# Patient Record
Sex: Male | Born: 1965 | Race: Asian | Smoking: Current some day smoker
Health system: Southern US, Community
[De-identification: ages and names within clinical notes are randomized; demographics above are authoritative.]

## PROBLEM LIST (undated history)

## (undated) HISTORY — PX: APPENDECTOMY: SHX54

## (undated) HISTORY — PX: CHOLECYSTECTOMY: SHX55

---

## 2015-06-18 ENCOUNTER — Other Ambulatory Visit: Payer: Self-pay | Admitting: Infectious Disease

## 2015-06-18 ENCOUNTER — Ambulatory Visit
Admission: RE | Admit: 2015-06-18 | Discharge: 2015-06-18 | Disposition: A | Discharge status: Home / Self Care | Payer: No Typology Code available for payment source | Source: Ambulatory Visit | Attending: Infectious Disease | Admitting: Infectious Disease

## 2015-06-18 DIAGNOSIS — Z111 Encounter for screening for respiratory tuberculosis: Secondary | ICD-10-CM

## 2015-08-08 ENCOUNTER — Other Ambulatory Visit: Payer: Self-pay | Admitting: Otolaryngology

## 2015-08-08 DIAGNOSIS — R430 Anosmia: Secondary | ICD-10-CM

## 2015-08-26 ENCOUNTER — Ambulatory Visit
Admission: RE | Admit: 2015-08-26 | Discharge: 2015-08-26 | Disposition: A | Discharge status: Home / Self Care | Payer: Commercial Managed Care - PPO | Source: Ambulatory Visit | Attending: Otolaryngology | Admitting: Otolaryngology

## 2015-08-26 DIAGNOSIS — R430 Anosmia: Secondary | ICD-10-CM

## 2015-08-26 MED ORDER — GADOBENATE DIMEGLUMINE 529 MG/ML IV SOLN
10.0000 mL | Freq: Once | INTRAVENOUS | Status: AC | PRN
  Administered 2015-08-26: 10 mL via INTRAVENOUS

## 2015-10-01 ENCOUNTER — Other Ambulatory Visit: Payer: Self-pay | Admitting: Infectious Disease

## 2015-10-01 ENCOUNTER — Ambulatory Visit
Admission: RE | Admit: 2015-10-01 | Discharge: 2015-10-01 | Disposition: A | Discharge status: Home / Self Care | Payer: No Typology Code available for payment source | Source: Ambulatory Visit | Attending: Infectious Disease | Admitting: Infectious Disease

## 2015-10-01 DIAGNOSIS — R7611 Nonspecific reaction to tuberculin skin test without active tuberculosis: Secondary | ICD-10-CM

## 2017-02-23 IMAGING — CR DG CHEST 1V
1 series · 1 of 1 positions shown · non-contrast
Comparison: 06/18/2015.

CLINICAL DATA: Positive PPD.  Follow up abnormal chest x-ray.

EXAM:
CHEST 1 VIEW

[w chest pa]
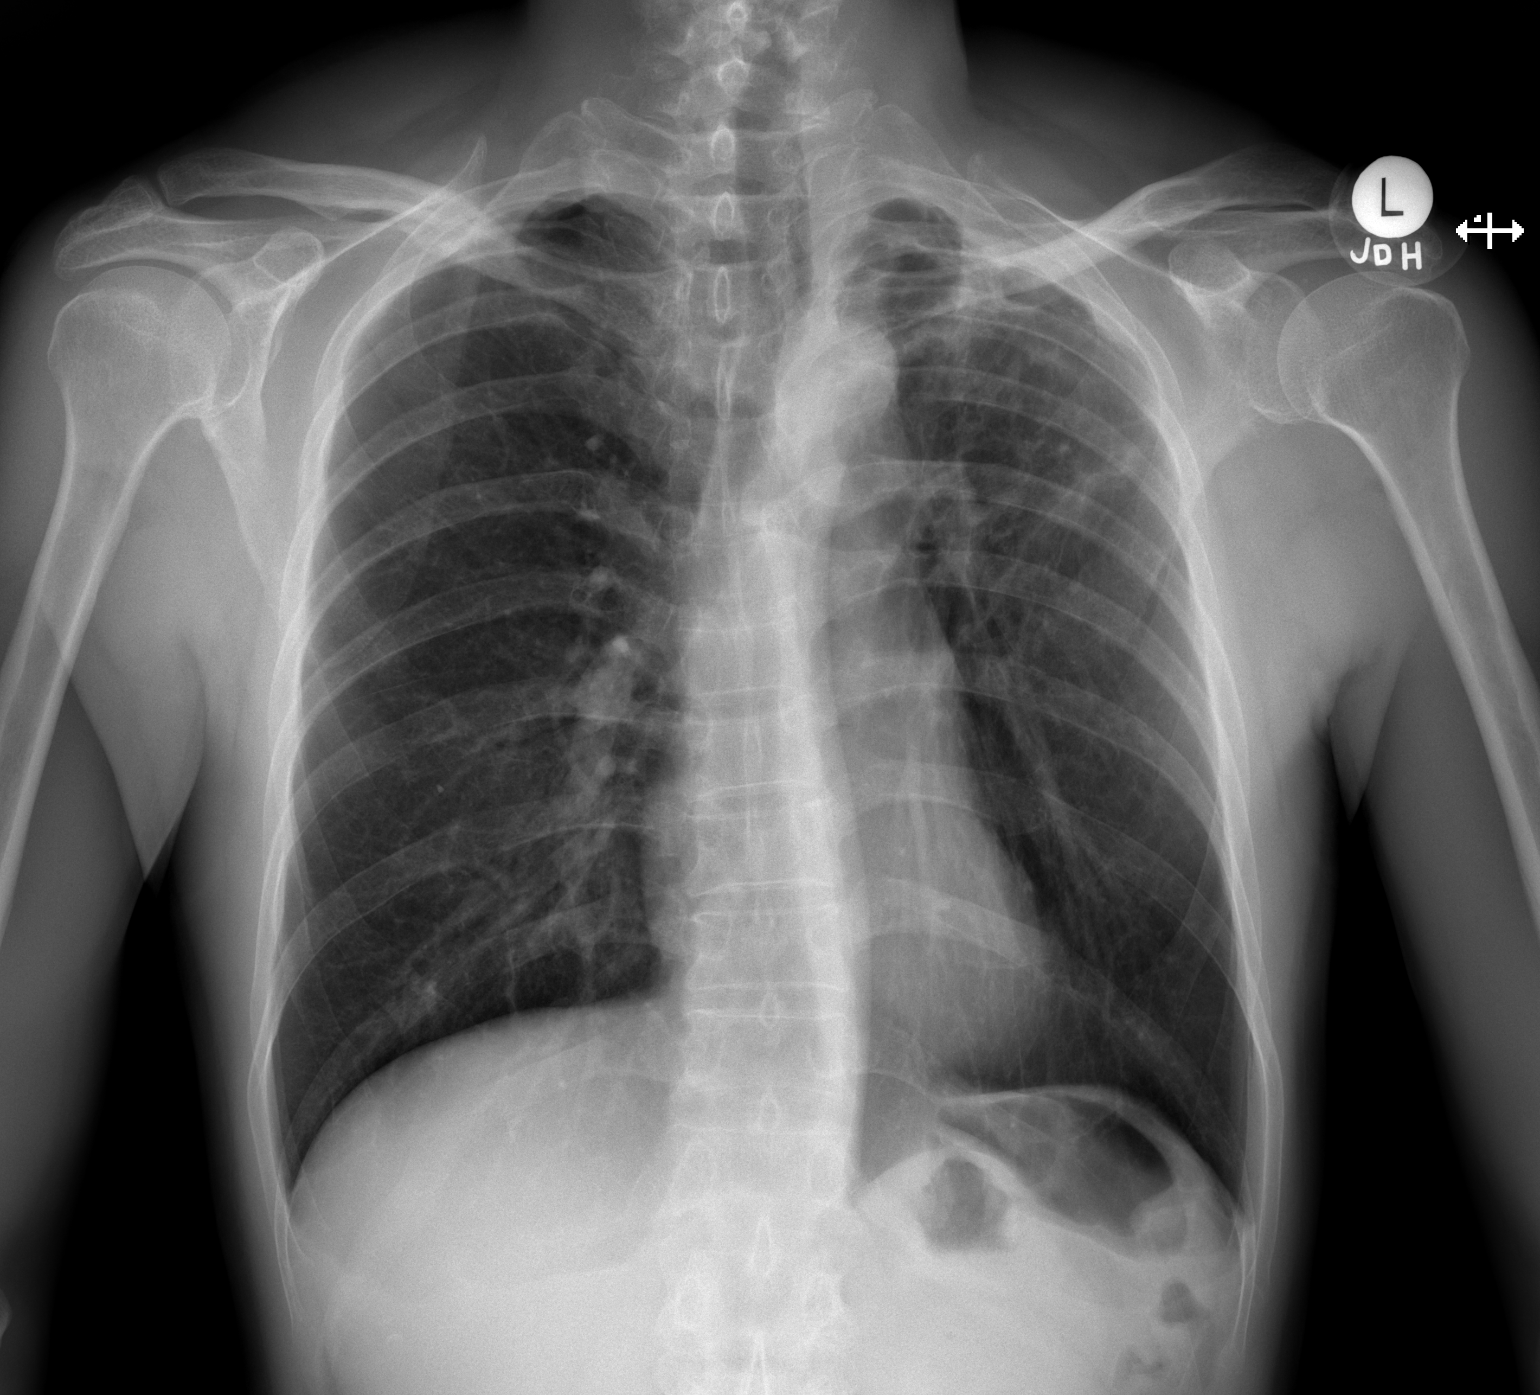

[1 of 1 positions shown; findings below may reference images not displayed]

FINDINGS: The heart size and mediastinal contours are stable with left
superior hilar retraction. There is stable left apical
pleural-parenchymal density consistent with prior tuberculosis
infection. No superimposed airspace disease, pleural effusion or
pneumothorax identified. The right lung is clear. The bones appear
unchanged.
IMPRESSION: Stable appearance of the chest compared with prior study from
months ago. Appearance remains most consistent with prior
tuberculosis infection. No acute findings seen.

## 2020-08-19 ENCOUNTER — Other Ambulatory Visit: Payer: Self-pay

## 2020-08-20 ENCOUNTER — Encounter: Payer: Self-pay | Admitting: Family Medicine

## 2020-08-20 ENCOUNTER — Ambulatory Visit (INDEPENDENT_AMBULATORY_CARE_PROVIDER_SITE_OTHER): Payer: 59 | Admitting: Family Medicine

## 2020-08-20 VITALS — BP 118/68 | HR 63 | Temp 98.0°F | Ht 63.0 in | Wt 113.2 lb

## 2020-08-20 DIAGNOSIS — T733XXA Exhaustion due to excessive exertion, initial encounter: Secondary | ICD-10-CM

## 2020-08-20 DIAGNOSIS — Q684 Congenital bowing of tibia and fibula: Secondary | ICD-10-CM | POA: Diagnosis not present

## 2020-08-20 DIAGNOSIS — R627 Adult failure to thrive: Secondary | ICD-10-CM | POA: Diagnosis not present

## 2020-08-20 DIAGNOSIS — Z13220 Encounter for screening for lipoid disorders: Secondary | ICD-10-CM

## 2020-08-20 DIAGNOSIS — E785 Hyperlipidemia, unspecified: Secondary | ICD-10-CM | POA: Insufficient documentation

## 2020-08-20 DIAGNOSIS — R350 Frequency of micturition: Secondary | ICD-10-CM

## 2020-08-20 LAB — LIPID PANEL
Cholesterol: 223 mg/dL — ABNORMAL HIGH (ref 0–200)
HDL: 66.3 mg/dL (ref >39.00)
LDL Cholesterol: 138 mg/dL — ABNORMAL HIGH (ref 0–99)
NonHDL: 157.05
Total CHOL/HDL Ratio: 3
Triglycerides: 96 mg/dL (ref 0.0–149.0)
VLDL: 19.2 mg/dL (ref 0.0–40.0)

## 2020-08-20 LAB — CBC
HCT: 42.4 % (ref 39.0–52.0)
Hemoglobin: 14.4 g/dL (ref 13.0–17.0)
MCHC: 33.9 g/dL (ref 30.0–36.0)
MCV: 91.1 fl (ref 78.0–100.0)
Platelets: 223 10*3/uL (ref 150.0–400.0)
RBC: 4.65 Mil/uL (ref 4.22–5.81)
RDW: 13.6 % (ref 11.5–15.5)
WBC: 5.4 10*3/uL (ref 4.0–10.5)

## 2020-08-20 NOTE — Progress Notes (Signed)
[facility] [address] [place] [address] Dept: [phone] Dept Fax: [phone]  New Patient Office Visit  Subjective:    Patient ID: Travis Knight, male    DOB: Jan 24, 1966, 55 y.o..   MRN: [id number]  Chief Complaint  Patient presents with  . Establish Care    NP- CPE/labs. C/o getting fatigue with physical activity and wants to gain weight but unable to do so.     History of Present Illness:  Patient is in today to establish care. He was accompanied by Travis Knight a [place] interpreter with Travis Knight.  Travis Knight is originally from a [place] village in [place]. He immigrated to [place] 7 years ago. He is separated from his wife. He lives with a daughter. He has four children, though one is deceased. Travis Knight works for a [employer] business. He admits to smoking 1 cigarette a day. He denies use of alcohol or drugs.  Travis Knight complains of frequent feelings of fatigue and an inability to gain weight. He has a very physically demanding job. He only eats a very small breakfast. During his workday, he is not allowed time for a lunch break. He drinks coffee throughout the day. He eats a larger dinner. Despite this, he finds he has been unable to gain weight.  At the end of the visit, he brought up an issue with urinary frequency/nocturia.  Past Medical History: Patient Active Problem List   Diagnosis Date Noted  . 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 Fatigue due to excessive exertion 08/20/2020  . Poor weight gain in adult 08/20/2020   Past Surgical History:  Procedure Laterality Date  . CHOLECYSTECTOMY     No family history on file.  No outpatient medications prior to visit.   No facility-administered medications prior to visit.   No Known Allergies    Objective:   Today's Vitals   08/20/20 0842  BP: 118/68  Pulse: 63  Temp: 98 F (36.7 C)  TempSrc: Temporal  SpO2: 98%  Weight: 113 lb 3.2 oz  (51.3 kg)  Height: 5\' 3"  (1.6 m)   Body mass index is 20.05 kg/m.   General: Slim. No acute distress. HEENT: Normocephalic, non-traumatic. PERRL, EOMI. Conjunctiva clear. Fundiscopic exam shows normal  disc and vasculature. External ears normal. EAC and TMs normal bilaterally. Nose    clear without congestion or rhinorrhea. Mucous membranes moist. Oropharynx clear. Poor dentition. Neck: Supple. No lymphadenopathy. No thyromegaly. Lungs: Clear to auscultation bilaterally. No wheezing, rales or rhonchi. CV: RRR without murmurs or rubs. Pulses 2+ bilaterally. Abdomen: Soft, non-tender. Bowel sounds positive, normal pitch and frequency. No hepatosplenomegaly. No   rebound or guarding. Well healed vertical scar in the upper mid abdomen. Back: Straight. No CVA tenderness bilaterally. Extremities: Full ROM. Mild joint popping in elbows, shoulders and knees. Both tibias show lateral bowing in   the upper third of the bone. This causes the fibular head to be quite prominent. No  joint swelling or   tenderness. No edema noted. Skin: Warm and dry. No rashes. Psych: Alert and oriented. Normal mood and affect.  Health Maintenance Due  Topic Date Due  . Hepatitis C Screening  Never done  . COVID-19 Vaccine (1) Never done  . HIV Screening  Never done  . TETANUS/TDAP  Never done  . COLONOSCOPY (Pts 45-69yrs Insurance coverage will need to be confirmed)  Never done     Assessment & Plan:   1. Fatigue due  to excessive exertion, initial encounter [name] fatigue appears to be related to heavy physical exertion and lack of adequate daily calories. However, I will check a CBC to rule out any potential anemia. We discussed him attempting to eat a larger breakfast. Today's visit was shortened due to time constraints, so will try and address further at his next visit.  - CBC  2. Poor weight gain in adult As above, I suspect his weight concerns are due to excessive exertion and inadequate calorie  intake. He does not have options for other work currently.  3. Congenital bowed tibia Both upper tibia have a valgus derformity. This appears to be congenital or from childhood. He is able to ambulate and work. I suspect he may be at risk for arthritic complaints a he ages.  4. Screening for lipid disorders  - Lipid panel  His use of 1 cigarette a day may not constitute a problem. We did discuss the lack of evidence for a safe level of tobacco use, but neither can I clearly state that his minimal use is dangerous.  I recommended he have a colonoscopy. He declined this.  5. Urinary frequency We will address this further at his next visit.  Travis Mast, MD

## 2020-09-26 ENCOUNTER — Ambulatory Visit: Payer: 59 | Admitting: Family Medicine

## 2020-10-08 ENCOUNTER — Ambulatory Visit: Payer: 59 | Admitting: Family Medicine

## 2020-10-08 ENCOUNTER — Encounter: Payer: Self-pay | Admitting: Family Medicine

## 2020-10-08 ENCOUNTER — Other Ambulatory Visit: Payer: Self-pay

## 2020-10-08 VITALS — BP 106/64 | HR 83 | Temp 97.9°F | Ht 63.0 in | Wt 109.4 lb

## 2020-10-08 DIAGNOSIS — R627 Adult failure to thrive: Secondary | ICD-10-CM | POA: Diagnosis not present

## 2020-10-08 DIAGNOSIS — R350 Frequency of micturition: Secondary | ICD-10-CM

## 2020-10-08 DIAGNOSIS — R7303 Prediabetes: Secondary | ICD-10-CM | POA: Insufficient documentation

## 2020-10-08 LAB — URINALYSIS, ROUTINE W REFLEX MICROSCOPIC
Bilirubin Urine: NEGATIVE
Hgb urine dipstick: NEGATIVE
Ketones, ur: NEGATIVE
Leukocytes,Ua: NEGATIVE
Nitrite: NEGATIVE
RBC / HPF: NONE SEEN (ref 0–?)
Specific Gravity, Urine: 1.02 (ref 1.000–1.030)
Total Protein, Urine: NEGATIVE
Urine Glucose: NEGATIVE
Urobilinogen, UA: 0.2 (ref 0.0–1.0)
pH: 5.5 (ref 5.0–8.0)

## 2020-10-08 LAB — COMPREHENSIVE METABOLIC PANEL
ALT: 16 U/L (ref 0–53)
AST: 25 U/L (ref 0–37)
Albumin: 4.7 g/dL (ref 3.5–5.2)
Alkaline Phosphatase: 51 U/L (ref 39–117)
BUN: 17 mg/dL (ref 6–23)
CO2: 27 mEq/L (ref 19–32)
Calcium: 9.5 mg/dL (ref 8.4–10.5)
Chloride: 102 mEq/L (ref 96–112)
Creatinine, Ser: 1.02 mg/dL (ref 0.40–1.50)
GFR: 83.27 mL/min (ref >60.00)
Glucose, Bld: 87 mg/dL (ref 70–99)
Potassium: 4.2 mEq/L (ref 3.5–5.1)
Sodium: 139 mEq/L (ref 135–145)
Total Bilirubin: 0.5 mg/dL (ref 0.2–1.2)
Total Protein: 7.4 g/dL (ref 6.0–8.3)

## 2020-10-08 LAB — TSH: TSH: 2.67 u[IU]/mL (ref 0.35–4.50)

## 2020-10-08 LAB — PSA: PSA: 1.21 ng/mL (ref 0.10–4.00)

## 2020-10-08 LAB — HEMOGLOBIN A1C: Hgb A1c MFr Bld: 6.1 % (ref 4.6–6.5)

## 2020-10-08 NOTE — Progress Notes (Signed)
[facility] [address] [place] [address] Dept: [phone] Dept Fax: [phone]  Office Visit  Subjective:    Patient ID: Travis Knight, male    DOB: 18-Jan-1966, 55 y.o..   MRN: [id number]  Chief Complaint  Patient presents with  . Follow-up    Still having urine frequency (10 x day),. Also having trouble sleeping (only about 5 hours a night).      History of Present Illness:  Patient is in today for reassessment of his poor weight gain and to evaluate his complaint of urinary frequency. Travis Knight is accompanied by his brother-in-law, who he used to live with in [place]. We also utilized a medical interpreter Thurston Hole, [phone]).  Travis Knight notes that he has been in good health since his last appointment. He continues to work long hours almost every day. He notes he continues to not eat very much. He denies any hunger issues. His brother-in-law notes that the eating has been an issue even prior to coming to the [place]. He states they try and provide some Ensure for him, but that seems to satiate him and he doesn't eat further. Travis Knight also complains of urinary frequency, but denies any straining. He gets up to urinate 6-7 times a night, which also predates his immigration. He denies any visual blurring and has no family history of diabetes. He denies any nausea, vomiting, diarrhea, or constipation. He also has no abdominal pain. He has had a previous open cholecystectomy.  Past Medical History: Patient Active Problem List   Diagnosis Date Noted  . 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 Fatigue due to excessive exertion 08/20/2020  . Poor weight gain in adult 08/20/2020  . Borderline hyperlipidemia 08/20/2020   Past Surgical History:  Procedure Laterality Date  . CHOLECYSTECTOMY     No family history on file.  No outpatient medications prior to visit.   No facility-administered medications prior to visit.   No Known Allergies     Objective:   Today's Vitals   10/08/20 0840  BP: 106/64  Pulse: 83  Temp: 97.9 F (36.6 C)  TempSrc: Temporal  SpO2: 96%  Weight: 109 lb 6.4 oz (49.6 kg)  Height: 5\' 3"  (1.6 m)   Body mass index is 19.38 kg/m.   General: Thin, elderly male. No acute distress. HEENT: Normocephalic, non-traumatic. Mucous membranes moist. Oropharynx clear. Good dentition. Neck: Supple. No lymphadenopathy. No thyromegaly. Lungs: Clear to auscultation bilaterally. No wheezing, rales or rhonchi. CV: RRR without murmurs or rubs. Pulses 2+ bilaterally. Abdomen: Soft, non-tender. Midline vertical surgical scar between umbilicus and epigastrium.  Bowel sounds positive, normal  pitch and frequency. No hepatosplenomegaly. No rebound or guarding. Back: Straight. No CVA tenderness bilaterally. Extremities: No edema noted. Skin: Warm and dry. No rashes. Psych: Alert and oriented. Normal mood and affect.  Health Maintenance Due  Topic Date Due  . COVID-19 Vaccine (1) Never done  . HIV Screening  Never done  . Hepatitis C Screening  Never done  . TETANUS/TDAP  Never done  . COLONOSCOPY (Pts 45-32yrs Insurance coverage will need to be confirmed)  Never done     Assessment & Plan:   1. Poor weight gain in adult It is unclear what the etiology of this might be. I will do screening for liver, kidney, thyroid, and metabolic issues that might contribute, esp. possible diabetes. I will have him return in two weeks for a review of his lab results.  - Comprehensive  metabolic panel - TSH - Hemoglobin A1c  2. Urinary frequency As above, I feel he needs screening for diabetes. I also recommend we assess him for UTI and prostate issues.  - Hemoglobin A1c - Urinalysis, Routine w reflex microscopic - PSA  Loyola Mast, MD

## 2020-10-24 ENCOUNTER — Other Ambulatory Visit: Payer: Self-pay

## 2020-10-24 ENCOUNTER — Encounter: Payer: Self-pay | Admitting: Family Medicine

## 2020-10-24 ENCOUNTER — Ambulatory Visit: Payer: 59 | Admitting: Family Medicine

## 2020-10-24 VITALS — BP 118/64 | HR 63 | Temp 98.4°F | Ht 63.0 in | Wt 112.2 lb

## 2020-10-24 DIAGNOSIS — R627 Adult failure to thrive: Secondary | ICD-10-CM | POA: Diagnosis not present

## 2020-10-24 DIAGNOSIS — R7303 Prediabetes: Secondary | ICD-10-CM

## 2020-10-24 NOTE — Progress Notes (Signed)
  [facility] [address] [place] [address] Dept: [phone] Dept Fax: [phone]  Office Visit  Subjective:    Patient ID: Travis Knight, male    DOB: 1966/04/24, 55 y.o..   MRN: [id number]  Chief Complaint  Patient presents with   Follow-up    F/u labs. No concerns.     History of Present Illness:  Patient is in today for today to review labs related to evaluating for causes of his poor weight gain and his complaint of urinary frequency. Travis Knight is accompanied by his brother-in-law. We also utilized a medical interpreter Travis Knight, [phone]).  Travis Knight denies any new issues since his last visit. He wonders about whether I can prescribe a medicaiton to make him gain weight.  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  No outpatient medications prior to visit.   No facility-administered medications prior to visit.   No Known Allergies    Objective:   Today's Vitals   10/24/20 0853  BP: 118/64  Pulse: 63  Temp: 98.4 F (36.9 C)  TempSrc: Temporal  SpO2: 96%  Weight: 112 lb 3.2 oz (50.9 kg)  Height: 5\' 3"  (1.6 m)   Body mass index is 19.88 kg/m.   General: Well developed, well nourished. No acute distress. Psych: Alert and oriented. Normal mood and affect.  Health Maintenance Due  Topic Date Due   COVID-19 Vaccine (1) Never done   Pneumococcal Vaccine 71-26 Years old (1 - PCV) Never done   HIV Screening  Never done   Hepatitis C Screening  Never done   TETANUS/TDAP  Never done   Zoster Vaccines- Shingrix (1 of 2) Never done   Lab Results CMP Latest Ref Rng & Units 10/08/2020   Glucose 70 - 99 mg/dL 87  BUN 6 - 23 mg/dL 17  Creatinine 10/10/2020 - 9.48 mg/dL 0.16  Sodium 5.53 - 748 mEq/L 139  Potassium 3.5 - 5.1 mEq/L 4.2  Chloride 96 - 112 mEq/L 102  CO2 19 - 32 mEq/L 27  Calcium 8.4 - 10.5 mg/dL 9.5  Total Protein 6.0 - 8.3 g/dL 7.4  Total Bilirubin 0.2 - 1.2 mg/dL 0.5  Alkaline Phos 39 - 117 U/L 51  AST 0 - 37 U/L 25  ALT 0 - 53 U/L 16    Lab Results  Component Value Date   PSA 1.21 10/08/2020    Lab Results  Component Value Date   HGBA1C 6.1 10/08/2020   Lab Results  Component Value Date   TSH 2.67 10/08/2020     Assessment & Plan:   1. Poor weight gain in adult Weight is up 3 lbs. since last visit. I reassured him that his lab tests do not indicate any underlying disease that could be causing his weight loss. I will plan to reassess him in 6 months. I recommend he try to eat regular meals, which he has skipped at times due to a busy workload.  2. Prediabetes Travis Knight's HbA1c is in a prediabetes range. I recommend we reassess this and his lipid level in 6 months.  10/10/2020, MD

## 2021-04-29 ENCOUNTER — Ambulatory Visit: Payer: 59 | Admitting: Family Medicine

## 2022-01-21 ENCOUNTER — Emergency Department (HOSPITAL_COMMUNITY)
Admission: EM | Admit: 2022-01-21 | Discharge: 2022-01-21 | Disposition: A | Discharge status: Home / Self Care | Payer: Commercial Managed Care - HMO | Attending: Emergency Medicine | Admitting: Emergency Medicine

## 2022-01-21 ENCOUNTER — Emergency Department (HOSPITAL_COMMUNITY): Payer: Commercial Managed Care - HMO

## 2022-01-21 DIAGNOSIS — R079 Chest pain, unspecified: Secondary | ICD-10-CM | POA: Diagnosis present

## 2022-01-21 LAB — BASIC METABOLIC PANEL
Anion gap: 6 (ref 5–15)
BUN: 17 mg/dL (ref 6–20)
CO2: 26 mmol/L (ref 22–32)
Calcium: 8.8 mg/dL — ABNORMAL LOW (ref 8.9–10.3)
Chloride: 109 mmol/L (ref 98–111)
Creatinine, Ser: 1.03 mg/dL (ref 0.61–1.24)
GFR, Estimated: >60 mL/min (ref >60)
Glucose, Bld: 96 mg/dL (ref 70–99)
Potassium: 3.9 mmol/L (ref 3.5–5.1)
Sodium: 141 mmol/L (ref 135–145)

## 2022-01-21 LAB — CBC
HCT: 42.6 % (ref 39.0–52.0)
Hemoglobin: 14.1 g/dL (ref 13.0–17.0)
MCH: 30.6 pg (ref 26.0–34.0)
MCHC: 33.1 g/dL (ref 30.0–36.0)
MCV: 92.4 fL (ref 80.0–100.0)
Platelets: 272 10*3/uL (ref 150–400)
RBC: 4.61 MIL/uL (ref 4.22–5.81)
RDW: 13.5 % (ref 11.5–15.5)
WBC: 8.6 10*3/uL (ref 4.0–10.5)
nRBC: 0 % (ref 0.0–0.2)

## 2022-01-21 LAB — TROPONIN I (HIGH SENSITIVITY)
Troponin I (High Sensitivity): 3 ng/L (ref <18)
Troponin I (High Sensitivity): 4 ng/L (ref <18)

## 2022-01-21 MED ORDER — ACETAMINOPHEN 500 MG PO TABS: 500.0000 mg | ORAL_TABLET | Freq: Four times a day (QID) | ORAL | 0 refills | Status: AC | PRN

## 2022-01-21 NOTE — ED Provider Notes (Signed)
[facility]-EMERGENCY DEPT Provider Note   CSN: [id number] Arrival date & time: 01/21/22  1428     History  Chief Complaint  Patient presents with   Chest Pain    Travis Knight is a 56 y.o. male history of prediabetes and hypertension here presenting with chest pain.  Patient states that he has left-sided chest pain since yesterday.  He states that it is intermittent.  Patient took extra dose of his blood pressure medicine today and the chest pain subsided.  Denies any shortness of breath.  Denies any abdominal pain.  Patient has no known CAD or any recent travel.  Patient speaks [place] and family is here translating.  The history is provided by the patient.       Home Medications Prior to Admission medications   Not on File      Allergies    Patient has no known allergies.    Review of Systems   Review of Systems  Cardiovascular:  Positive for chest pain.  All other systems reviewed and are negative.   Physical Exam Updated Vital Signs BP (!) 112/59 (BP Location: Left Arm)   Pulse (!) 54   Temp 98.5 F (36.9 C) (Oral)   Resp 20   Ht 5\' 3"  (1.6 m)   Wt 50.9 kg   SpO2 99%   BMI 19.88 kg/m  Physical Exam Vitals and nursing note reviewed.  Constitutional:      Appearance: He is well-developed.  HENT:     Head: Normocephalic.  Eyes:     Extraocular Movements: Extraocular movements intact.     Pupils: Pupils are equal, round, and reactive to light.  Cardiovascular:     Rate and Rhythm: Normal rate and regular rhythm.     Heart sounds: Normal heart sounds.  Pulmonary:     Effort: Pulmonary effort is normal.     Breath sounds: Normal breath sounds.  Abdominal:     General: Bowel sounds are normal.     Palpations: Abdomen is soft.  Musculoskeletal:        General: Normal range of motion.     Cervical back: Normal range of motion and neck supple.  Skin:    General: Skin is warm.  Neurological:     General: No focal deficit present.      Mental Status: He is alert and oriented to person, place, and time.  Psychiatric:        Mood and Affect: Mood normal.        Behavior: Behavior normal.     ED Results / Procedures / Treatments   Labs (all labs ordered are listed, but only abnormal results are displayed) Labs Reviewed  BASIC METABOLIC PANEL - Abnormal; Notable for the following components:      Result Value   Calcium 8.8 (*)    All other components within normal limits  CBC  TROPONIN I (HIGH SENSITIVITY)  TROPONIN I (HIGH SENSITIVITY)    EKG EKG Interpretation  Date/Time:  Thursday January 21 2022 14:45:27 EDT Ventricular Rate:  52 PR Interval:  164 QRS Duration: 90 QT Interval:  416 QTC Calculation: 387 R Axis:   96 Text Interpretation: Sinus rhythm Borderline right axis deviation ST elevation, consider inferior injury repeat ordered No old tracing to compare Confirmed by 04-08-1988 Derwood Kaplan) on 01/21/2022 3:11:46 PM  Radiology DG Chest 2 View  Result Date: 01/21/2022 CLINICAL DATA:  Chest pain. EXAM: CHEST - 2 VIEW COMPARISON:  Chest radiograph Oct 01, 2015. FINDINGS: The heart size and mediastinal contours are within normal limits. Stable left apical pleuroparenchymal density consistent with prior tuberculous infection. No new superimposed airspace disease. No pleural effusion. No pneumothorax. The visualized skeletal structures are unremarkable. IMPRESSION: No acute cardiopulmonary disease. Electronically Signed   By: Maudry Mayhew M.D.   On: 01/21/2022 15:38    Procedures Procedures    Medications Ordered in ED Medications - No data to display  ED Course/ Medical Decision Making/ A&P                           Medical Decision Making Travis Knight is a 56 y.o. male here with chest pain.  Intermittent chest pain since yesterday.  Patient is chest pain-free currently.  He did take extra dose of his blood pressure medicine.  Low suspicion for ACS or PE.  Plan to get CBC and BMP and troponin x2  and chest x-ray.  7:49 PM Trop neg x 2. Labs reviewed and unremarkable.  Stable for discharge  Problems Addressed: Chest pain, unspecified type: acute illness or injury  Amount and/or Complexity of Data Reviewed Labs: ordered. Decision-making details documented in ED Course. Radiology: ordered and independent interpretation performed. Decision-making details documented in ED Course. ECG/medicine tests: ordered and independent interpretation performed. Decision-making details documented in ED Course.    Final Clinical Impression(s) / ED Diagnoses Final diagnoses:  None    Rx / DC Orders ED Discharge Orders     None         Charlynne Pander, MD 01/21/22 1950

## 2022-01-21 NOTE — ED Notes (Signed)
An After Visit Summary was printed and given to the patient. Discharge instructions given and no further questions at this time.  Pt and pt family did not wait for discharge vital signs.

## 2022-01-21 NOTE — ED Provider Triage Note (Signed)
Emergency Medicine Provider Triage Evaluation Note  Travis Knight , a 56 y.o. male  was evaluated in triage.  Pt complains of chest pain since yesterday  Review of Systems  Positive: pain Negative: No fever or cough  Physical Exam  BP 110/68 (BP Location: Left Arm)   Pulse (!) 52   Temp 98.6 F (37 C) (Oral)   Resp 16   Ht 5\' 3"  (1.6 m)   Wt 50.9 kg   SpO2 98%   BMI 19.88 kg/m  Gen:   Awake, no distress   Resp:  Normal effort  MSK:   Moves extremities without difficulty  Other:    Medical Decision Making  Medically screening exam initiated at 3:32 PM.  Appropriate orders placed.  Ledon Weihe was informed that the remainder of the evaluation will be completed by another provider, this initial triage assessment does not replace that evaluation, and the importance of remaining in the ED until their evaluation is complete.     Alda Berthold, Elson Areas 01/21/22 1533

## 2022-01-21 NOTE — ED Triage Notes (Addendum)
Per family, patient c/o L chest pain since yesterday. Reports when he has the pain it makes him more tired. Denies SOB. Reports history of same last year. States took medication with relief, but does not know medication name. Patient speaks [place], offered to use interpreter and family declined.  Family member reports patient taking 2.5mg  nebivolol.

## 2022-01-21 NOTE — Discharge Instructions (Signed)
Please avoid taking too much of her blood pressure medicine.  Heart enzymes are normal right now.  If you have persistent pain please see cardiology for stress test  Return to ER if you have worse chest pain, trouble breathing

## 2022-01-25 NOTE — Progress Notes (Unsigned)
Cardiology Office Note:    Date:  01/26/2022   ID:  Travis, Knight [birth year], MRN [id number]  PCP:  Travis Mast, MD  Cardiologist:  Travis Herrlich, MD   Referring MD: Travis Pander, MD  ASSESSMENT:    1. 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 EKG with early repolarization pattern we will check echocardiogram in order to exclude pericardial abnormality or cardiomyopathy 2-week trial of nonsteroidal anti-inflammatory drug to break his pain cycle  Next appointment provided echocardiogram is reassuring follow-up as needed   Medication Adjustments/Labs and Tests Ordered: Current medicines are reviewed at length with the patient today.  Concerns regarding medicines are outlined above.  No orders of the defined types were placed in this encounter.  No orders of the defined types were placed in this encounter.    Chief Complaint  Patient presents with   Chest Pain  I was seen in the emergency room.  History of Present Illness:    Travis Knight is a 56 y.o. male who is being seen today for the evaluation of chest pain at the request of Travis Pander, MD.  Seen in the emergency room at [facility] 01/21/2022 for chest pain high-sensitivity troponin was low at 3 chest x-ray showed no acute changes EKG showed sinus rhythm right axis deviation and early repolarization pattern.  I had a medical interpreter present for the visit He has had chronic chest pain for months to a year It is intermittent nonexertional localized to the xiphoid area and CC J bilaterally Is not anginal in nature no radiation to the shoulder or jaw on relieved with rest and nonexertional in nature He has no history of chest trauma it is not pleuritic in nature and he is not short of  breath He does do repetitive heavy lifting at work  Past history is no history of heart disease  Past Surgical History:  Procedure Laterality Date   APPENDECTOMY     CHOLECYSTECTOMY      Current Medications: Current Meds  Medication Sig   acetaminophen (TYLENOL) 500 MG tablet Take 1 tablet (500 mg total) by mouth every 6 (six) hours as needed.     Allergies:   Patient has no known allergies.   Social History   Socioeconomic History   Marital status: Legally Separated    Spouse name: Not on file   Number of children: 4   Years of education: Not on file   Highest education level: Not on file  Occupational History   Not on file  Tobacco Use   Smoking status: Some Days    Passive exposure: Current   Smokeless tobacco: Never   Tobacco comments:    Smokes 1 cigarette a day.  Vaping Use   Vaping Use: Never used  Substance and Sexual Activity   Alcohol use: Not Currently   Drug use: Never   Sexual activity: Yes  Other Topics Concern   Not on file  Social History Narrative   Not on file   Social Determinants of Health   Financial Resource Strain: Not on file  Food Insecurity: Not on file  Transportation Needs: Not on file  Physical Activity: Not on file  Stress: Not on file  Social Connections: Not on file  Family History: The patient's family history is negative for Heart Problems.  ROS:   ROS Please see the history of present illness.     All other systems reviewed and are negative.  EKGs/Labs/Other Studies Reviewed:    The following studies were reviewed today:   EKG:  EKG 01/21/2022 independently reviewed he has minor early repolarization seen otherwise normal EKG  Recent Labs: 01/21/2022: BUN 17; Creatinine, Ser 1.03; Hemoglobin 14.1; Platelets 272; Potassium 3.9; Sodium 141  Recent Lipid Panel    Component Value Date/Time   CHOL 223 (H) 08/20/2020 0925   TRIG 96.0 08/20/2020 0925   HDL 66.30 08/20/2020 0925   CHOLHDL 3 08/20/2020 0925    VLDL 19.2 08/20/2020 0925   LDLCALC 138 (H) 08/20/2020 0925    Physical Exam:    VS:  BP (!) 148/76 (BP Location: Left Arm, Patient Position: Sitting)   Pulse (!) 54   Ht 5\' 3"  (1.6 m)   Wt 113 lb (51.3 kg)   SpO2 98%   BMI 20.02 kg/m     Wt Readings from Last 3 Encounters:  01/26/22 113 lb (51.3 kg)  01/21/22 112 lb 3.4 oz (50.9 kg)  10/24/20 112 lb 3.2 oz (50.9 kg)     GEN:  Well nourished, well developed in no acute distress HEENT: Normal NECK: No JVD; No carotid bruits LYMPHATICS: No lymphadenopathy CARDIAC: He has mild pectus excavatum deformity and tenderness along the xiphoid and CC J area reproducing his symptoms RRR, no murmurs, rubs, gallops RESPIRATORY:  Clear to auscultation without rales, wheezing or rhonchi  ABDOMEN: Soft, non-tender, non-distended MUSCULOSKELETAL:  No edema; No deformity  SKIN: Warm and dry NEUROLOGIC:  Alert and oriented x 3 PSYCHIATRIC:  Normal affect     Signed, 12/24/20, MD  01/26/2022 10:34 AM    [facility]

## 2022-01-26 ENCOUNTER — Encounter: Payer: Self-pay | Admitting: Cardiology

## 2022-01-26 ENCOUNTER — Encounter: Payer: Self-pay | Admitting: Family Medicine

## 2022-01-26 ENCOUNTER — Ambulatory Visit (INDEPENDENT_AMBULATORY_CARE_PROVIDER_SITE_OTHER): Payer: Commercial Managed Care - HMO | Admitting: Family Medicine

## 2022-01-26 ENCOUNTER — Ambulatory Visit: Payer: Commercial Managed Care - HMO | Attending: Cardiology | Admitting: Cardiology

## 2022-01-26 VITALS — BP 148/76 | HR 54 | Ht 63.0 in | Wt 113.0 lb

## 2022-01-26 VITALS — BP 122/70 | HR 57 | Temp 98.2°F | Ht 63.0 in | Wt 113.8 lb

## 2022-01-26 DIAGNOSIS — R079 Chest pain, unspecified: Secondary | ICD-10-CM | POA: Diagnosis not present

## 2022-01-26 DIAGNOSIS — K2900 Acute gastritis without bleeding: Secondary | ICD-10-CM | POA: Diagnosis not present

## 2022-01-26 MED ORDER — OMEPRAZOLE 20 MG PO CPDR: 20.0000 mg | DELAYED_RELEASE_CAPSULE | Freq: Every day | ORAL | 1 refills | Status: DC

## 2022-01-26 MED ORDER — CELECOXIB 100 MG PO CAPS: 100.0000 mg | ORAL_CAPSULE | Freq: Two times a day (BID) | ORAL | 0 refills | Status: DC

## 2022-01-26 MED ORDER — CELECOXIB 100 MG PO CAPS: 100.0000 mg | ORAL_CAPSULE | Freq: Two times a day (BID) | ORAL | 3 refills | Status: DC

## 2022-01-26 NOTE — Progress Notes (Signed)
[facility] [address] [place] [address] Dept: [phone] Dept Fax: [phone]  Office Visit  Subjective:    Patient ID: Travis Knight, male    DOB: Sep 26, 1965, 56 y.o..   MRN: [id number]  Chief Complaint  Patient presents with   Acute Visit    C/o having chest pain/soreness x 2 days and left arm pain x 1 month.  Has taken Tylenol and Celebrex.     Medical Interpretor: Y Hin  History of Present Illness:  Patient is in today for follow-up from a recent ED visit for chest pain. He started having left-sdied chest pain on ~ 9/6. He describes this as a soreness in the lower chest. He denies any associated shortness of breaht, cough, nausea or diaphoresis. he does noted a sensation of fatigue and "dizziness" when the pain comes on. He reports a similar episode last year, for which he took Tylenol for 2 days and it resolved.  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 30 tablet 0   celecoxib (CELEBREX) 100 MG capsule Take 1 capsule (100 mg total) by mouth 2 (two) times daily. 28 capsule 0   No facility-administered medications prior to visit.   No Known Allergies    Objective:   Today's Vitals   01/26/22 1409  BP: 122/70  Pulse: (!) 57  Temp: 98.2 F (36.8 C)  TempSrc: Temporal  SpO2: 98%  Weight: 113 lb 12.8 oz (51.6 kg)  Height: 5\' 3"  (1.6 m)    Body mass index is 20.16 kg/m.   General: Well developed, well nourished. No acute distress. HEENT: Normocephalic, non-traumatic. Conjunctiva clear. Nose clear without congestion or rhinorrhea.   Mucous membranes moist. Oropharynx clear. Good dentition. Neck: Supple. No lymphadenopathy. No thyromegaly. Lungs: Clear to auscultation bilaterally. No wheezing, rales or rhonchi. CV: RRR without murmurs or rubs. Pulses 2+ bilaterally. Abdomen: Soft. Bowel sounds positive, normal pitch and frequency. No hepatosplenomegaly. There may be   some mild tenderness over the epigastrium. No rebound or guarding. Psych: Alert and oriented. Normal mood and affect.  Health Maintenance Due  Topic Date Due   COVID-19 Vaccine (1) Never done   HIV Screening  Never done   Hepatitis C Screening  Never done   TETANUS/TDAP  Never done   COLONOSCOPY (Pts 45-46yrs Insurance coverage will need to be confirmed)  Never done   Zoster Vaccines- Shingrix (1 of 2) Never done   INFLUENZA VACCINE  12/15/2021   Lab Results    Latest Ref Rng & Units 01/21/2022    3:08 PM 08/20/2020    9:25 AM  CBC  WBC 4.0 - 10.5 K/uL 8.6  5.4   Hemoglobin 13.0 - 17.0 g/dL 10/20/2020  15.1   Hematocrit 39.0 - 52.0 % 42.6  42.4   Platelets 150 - 400 K/uL 272  223.0  Latest Ref Rng & Units 01/21/2022    3:08 PM 10/08/2020    9:23 AM  BMP  Glucose 70 - 99 mg/dL 96  87   BUN 6 - 20 mg/dL 17  17   Creatinine 4.92 - 1.24 mg/dL 0.10  0.71   Sodium 219 - 145 mmol/L 141  139   Potassium 3.5 - 5.1 mmol/L 3.9  4.2   Chloride 98 - 111 mmol/L 109  102   CO2 22 - 32 mmol/L 26  27   Calcium 8.9 - 10.3 mg/dL 8.8  9.5    Component Ref Range & Units 5 d ago (01/21/22) 5 d ago (01/21/22)  Troponin I (High Sensitivity) <18 ng/L 4  3 CM    Imaging: Chest x-ray ( 01/21/2022) IMPRESSION: No acute cardiopulmonary disease.   Assessment & Plan:   1. Acute gastritis without hemorrhage, unspecified gastritis type I reviewed the ED note, lab results,  CXR report, and EKGs from his recent visit. His history and exam today are consistent with a GI source for his pain. I recommend we treat him with a 65-month course of Prilosec. I will s\ee him back then to reassess.  - omeprazole (PRILOSEC) 20 MG capsule; Take 1 capsule (20 mg total) by mouth daily.  Dispense: 30 capsule; Refill: 1  Return in about 4 weeks (around 02/23/2022) for Reassessment.   Loyola Mast, MD

## 2022-01-26 NOTE — Patient Instructions (Signed)
Medication Instructions:  Your physician has recommended you make the following change in your medication:   START: Celebrex 100 mg twice daily for 2 weeks  *If you need a refill on your cardiac medications before your next appointment, please call your pharmacy*   Lab Work: None If you have labs (blood work) drawn today and your tests are completely normal, you will receive your results only by: MyChart Message (if you have MyChart) OR A paper copy in the mail If you have any lab test that is abnormal or we need to change your treatment, we will call you to review the results.   Testing/Procedures: Your physician has requested that you have an echocardiogram. Echocardiography is a painless test that uses sound waves to create images of your heart. It provides your doctor with information about the size and shape of your heart and how well your heart's chambers and valves are working. This procedure takes approximately one hour. There are no restrictions for this procedure.    Follow-Up: At [facility], you and your health needs are our priority.  As part of our continuing mission to provide you with exceptional heart care, we have created designated Provider Care Teams.  These Care Teams include your primary Cardiologist (physician) and Advanced Practice Providers (APPs -  Physician Assistants and Nurse Practitioners) who all work together to provide you with the care you need, when you need it.  We recommend signing up for the patient portal called "MyChart".  Sign up information is provided on this After Visit Summary.  MyChart is used to connect with patients for Virtual Visits (Telemedicine).  Patients are able to view lab/test results, encounter notes, upcoming appointments, etc.  Non-urgent messages can be sent to your provider as well.   To learn more about what you can do with MyChart, go to [email or web address].    Your next appointment:   Follow up as needed  The  format for your next appointment:   In Person  Provider:   Norman Herrlich, MD    Other Instructions None  Important Information About Sugar

## 2022-01-26 NOTE — Addendum Note (Signed)
Addended by: Roxanne Mins I on: 01/26/2022 10:59 AM   Modules accepted: Orders

## 2022-02-01 ENCOUNTER — Other Ambulatory Visit (HOSPITAL_BASED_OUTPATIENT_CLINIC_OR_DEPARTMENT_OTHER): Payer: Commercial Managed Care - HMO

## 2022-02-10 ENCOUNTER — Ambulatory Visit (HOSPITAL_BASED_OUTPATIENT_CLINIC_OR_DEPARTMENT_OTHER)
Admission: RE | Admit: 2022-02-10 | Discharge: 2022-02-10 | Disposition: A | Discharge status: Home / Self Care | Payer: Commercial Managed Care - HMO | Source: Ambulatory Visit | Attending: Cardiology | Admitting: Cardiology

## 2022-02-10 DIAGNOSIS — R079 Chest pain, unspecified: Secondary | ICD-10-CM | POA: Insufficient documentation

## 2022-02-10 DIAGNOSIS — I361 Nonrheumatic tricuspid (valve) insufficiency: Secondary | ICD-10-CM

## 2022-02-10 LAB — ECHOCARDIOGRAM COMPLETE
AR max vel: 2.66 cm2
AV Area VTI: 2.4 cm2
AV Area mean vel: 2.32 cm2
AV Mean grad: 2 mmHg
AV Peak grad: 3.1 mmHg
Ao pk vel: 0.88 m/s
Area-P 1/2: 4.68 cm2
S' Lateral: 2.9 cm

## 2022-02-10 NOTE — Progress Notes (Signed)
  Echocardiogram 2D Echocardiogram has been performed.  Merrie Roof F 02/10/2022, 8:56 AM

## 2022-03-17 ENCOUNTER — Encounter: Payer: Self-pay | Admitting: Family Medicine

## 2022-03-17 ENCOUNTER — Ambulatory Visit (INDEPENDENT_AMBULATORY_CARE_PROVIDER_SITE_OTHER): Payer: Commercial Managed Care - HMO | Admitting: Family Medicine

## 2022-03-17 VITALS — BP 110/64 | HR 72 | Temp 98.1°F | Ht 63.0 in | Wt 112.4 lb

## 2022-03-17 DIAGNOSIS — K2970 Gastritis, unspecified, without bleeding: Secondary | ICD-10-CM

## 2022-03-17 DIAGNOSIS — Z23 Encounter for immunization: Secondary | ICD-10-CM | POA: Diagnosis not present

## 2022-03-17 DIAGNOSIS — R399 Unspecified symptoms and signs involving the genitourinary system: Secondary | ICD-10-CM | POA: Diagnosis not present

## 2022-03-17 DIAGNOSIS — R079 Chest pain, unspecified: Secondary | ICD-10-CM | POA: Diagnosis not present

## 2022-03-17 LAB — URINALYSIS, ROUTINE W REFLEX MICROSCOPIC
Bilirubin Urine: NEGATIVE
Hgb urine dipstick: NEGATIVE
Ketones, ur: NEGATIVE
Leukocytes,Ua: NEGATIVE
Nitrite: NEGATIVE
RBC / HPF: NONE SEEN (ref 0–?)
Specific Gravity, Urine: 1.01 (ref 1.000–1.030)
Total Protein, Urine: NEGATIVE
Urine Glucose: NEGATIVE
Urobilinogen, UA: 0.2 (ref 0.0–1.0)
WBC, UA: NONE SEEN (ref 0–?)
pH: 7.5 (ref 5.0–8.0)

## 2022-03-17 LAB — PSA: PSA: 1.16 ng/mL (ref 0.10–4.00)

## 2022-03-17 MED ORDER — TAMSULOSIN HCL 0.4 MG PO CAPS: 0.4000 mg | ORAL_CAPSULE | Freq: Every day | ORAL | 3 refills | Status: DC

## 2022-03-17 MED ORDER — CELECOXIB 100 MG PO CAPS: 100.0000 mg | ORAL_CAPSULE | Freq: Two times a day (BID) | ORAL | 0 refills | Status: DC

## 2022-03-17 MED ORDER — OMEPRAZOLE 20 MG PO CPDR: 20.0000 mg | DELAYED_RELEASE_CAPSULE | Freq: Every day | ORAL | 1 refills | Status: DC

## 2022-03-17 NOTE — Progress Notes (Signed)
[facility] [address] [place] [address] Dept: [phone] Dept Fax: [phone]  Chronic Care Office Visit  Subjective:    Patient ID: Travis Knight, male    DOB: 1966/04/16, 56 y.o..   MRN: [id number]  Chief Complaint  Patient presents with   Follow-up    4 week f/u.  C/o having increased urination.    Medical Interpretor: [place] Chung  History of Present Illness:  Patient is in today for reassessment of chronic medical issues.  Travis Knight presents to follow-up on left-sided chest pain. He had been initially seen in the ED in early Sept. with this. His initial work-up was negative for a cardiac etiology. He was followed up by cardiology, confirming this. I had seen him about a month ago and started him on a trial of omeprazole. At this point, he has had resolution of the chest pain and feels his stomach is doing well.  Travis Knight today complains of frequent urination. He urinates almost every hour during the day. He notes he feels a sudden urge to urinate and must go then. He is awakening 4-5 times a night to urinate. He does not have any straining with urination and is not having dribbling. He has not been incontinent.  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 30 tablet 0    celecoxib (CELEBREX) 100 MG capsule Take 1 capsule (100 mg total) by mouth 2 (two) times daily. 28 capsule 0   omeprazole (PRILOSEC) 20 MG capsule Take 1 capsule (20 mg total) by mouth daily. 30 capsule 1   No facility-administered medications prior to visit.   No Known Allergies    Objective:   Today's Vitals   03/17/22 0802  BP: 110/64  Pulse: 72  Temp: 98.1 F (36.7 C)  TempSrc: Temporal  SpO2: 99%  Weight: 112 lb 6.4 oz (51 kg)  Height: 5\' 3"  (1.6 m)   Body mass index is 19.91 kg/m.   General: Well developed, well nourished. No acute distress. Abdomen: Soft, non-tender. Bowel sounds positive, normal pitch and frequency. No   hepatosplenomegaly. No rebound or guarding. Psych: Alert and oriented. Normal mood and affect.  Health Maintenance Due  Topic Date Due   HIV Screening  Never done   Hepatitis C Screening  Never done   TETANUS/TDAP  Never done   COLONOSCOPY (Pts 45-58yrs Insurance coverage will need to be confirmed)  Never done   Zoster Vaccines- Shingrix (1 of 2) Never done   INFLUENZA VACCINE  12/15/2021   Imaging: Echocardiogram (02/10/2022) IMPRESSIONS   1. Technically difficult study. Left ventricular ejection fraction, by estimation, is 60 to 65%. The left ventricle has normal function. The left ventricle has no regional wall motion abnormalities. Left ventricular diastolic parameters were normal.   2. Right  ventricular systolic function is normal. The right ventricular size is normal. There is normal pulmonary artery systolic pressure.   3. The mitral valve is normal in structure. No evidence of mitral valve regurgitation. No evidence of mitral stenosis.   4. The aortic valve is normal in structure. Aortic valve regurgitation is not visualized. No aortic stenosis is present.   5. The inferior vena cava is normal in size with greater than 50% respiratory variability, suggesting right atrial pressure of 3 mmHg.   Lab Results    Latest Ref Rng & Units  01/21/2022    3:08 PM 10/08/2020    9:23 AM  BMP  Glucose 70 - 99 mg/dL 96  87   BUN 6 - 20 mg/dL 17  17   Creatinine 0.61 - 1.24 mg/dL 1.03  1.02   Sodium 135 - 145 mmol/L 141  139   Potassium 3.5 - 5.1 mmol/L 3.9  4.2   Chloride 98 - 111 mmol/L 109  102   CO2 22 - 32 mmol/L 26  27   Calcium 8.9 - 10.3 mg/dL 8.8  9.5      Assessment & Plan:   1. Gastritis without bleeding, unspecified chronicity, unspecified gastritis type Symptoms are improved. I recommend we continue this for another 3 months. We could then give him a trial off of the medication.  - omeprazole (PRILOSEC) 20 MG capsule; Take 1 capsule (20 mg total) by mouth daily.  Dispense: 30 capsule; Refill: 1  2. Lower urinary tract symptoms (LUTS) Travis Knight describes urinary frequency and nocturia. Recent blood sugar testing was normal. I will check a UA and a PSA. I suspect this is LUTS related to prostate enlargement. I will give him a trial of Flomax to see if this improves his symptoms.  - PSA - Urinalysis, Routine w reflex microscopic - tamsulosin (FLOMAX) 0.4 MG CAPS capsule; Take 1 capsule (0.4 mg total) by mouth daily.  Dispense: 30 capsule; Refill: 3  3. Chest pain of uncertain etiology Improved. I will continue the Celebrex for an additional month, we should then consider stopping this, as I don't want to worsen his gastritis.  - celecoxib (CELEBREX) 100 MG capsule; Take 1 capsule (100 mg total) by mouth 2 (two) times daily.  Dispense: 28 capsule; Refill: 0  4. Need for influenza vaccination  - Flu Vaccine QUAD 6+ mos PF IM (Fluarix Quad PF)  Return in about 3 months (around 06/17/2022) for Reassessment.   Haydee Salter, MD

## 2022-04-13 ENCOUNTER — Telehealth: Payer: Self-pay | Admitting: Family Medicine

## 2022-04-13 DIAGNOSIS — R399 Unspecified symptoms and signs involving the genitourinary system: Secondary | ICD-10-CM

## 2022-04-13 NOTE — Telephone Encounter (Signed)
Pt stated his medication constantly makes him go to the restroom

## 2022-04-14 NOTE — Telephone Encounter (Signed)
Patient states that he is uses the bathroom frequently during the day with better flow since starting the Flomax.    Should he continue to take this medication? Dm/cma

## 2022-04-15 NOTE — Telephone Encounter (Signed)
Patient's niece, notified and okay with referral.  Dm/cma

## 2022-06-18 ENCOUNTER — Ambulatory Visit (INDEPENDENT_AMBULATORY_CARE_PROVIDER_SITE_OTHER): Payer: Self-pay | Admitting: Family Medicine

## 2022-06-18 ENCOUNTER — Encounter: Payer: Self-pay | Admitting: Family Medicine

## 2022-06-18 VITALS — BP 124/68 | HR 69 | Temp 97.4°F | Ht 63.0 in | Wt 112.2 lb

## 2022-06-18 DIAGNOSIS — E785 Hyperlipidemia, unspecified: Secondary | ICD-10-CM

## 2022-06-18 DIAGNOSIS — R399 Unspecified symptoms and signs involving the genitourinary system: Secondary | ICD-10-CM

## 2022-06-18 DIAGNOSIS — R7303 Prediabetes: Secondary | ICD-10-CM

## 2022-06-18 DIAGNOSIS — R079 Chest pain, unspecified: Secondary | ICD-10-CM

## 2022-06-18 DIAGNOSIS — K2970 Gastritis, unspecified, without bleeding: Secondary | ICD-10-CM

## 2022-06-18 LAB — LIPID PANEL
Cholesterol: 239 mg/dL — ABNORMAL HIGH (ref 0–200)
HDL: 51.9 mg/dL (ref >39.00)
LDL Cholesterol: 159 mg/dL — ABNORMAL HIGH (ref 0–99)
NonHDL: 187.39
Total CHOL/HDL Ratio: 5
Triglycerides: 140 mg/dL (ref 0.0–149.0)
VLDL: 28 mg/dL (ref 0.0–40.0)

## 2022-06-18 LAB — HEMOGLOBIN A1C: Hgb A1c MFr Bld: 6.2 % (ref 4.6–6.5)

## 2022-06-18 LAB — GLUCOSE, RANDOM: Glucose, Bld: 103 mg/dL — ABNORMAL HIGH (ref 70–99)

## 2022-06-18 MED ORDER — TAMSULOSIN HCL 0.4 MG PO CAPS: 0.4000 mg | ORAL_CAPSULE | Freq: Every day | ORAL | 3 refills | Status: DC

## 2022-06-18 MED ORDER — CELECOXIB 100 MG PO CAPS: 100.0000 mg | ORAL_CAPSULE | Freq: Two times a day (BID) | ORAL | 0 refills | Status: DC

## 2022-06-18 NOTE — Progress Notes (Signed)
[facility] [address] [place] [address] Dept: [phone] Dept Fax: [phone]  Chronic Care Office Visit  Subjective:    Patient ID: Travis Knight, male    DOB: November 12, 1965, 57 y.o..   MRN: [id number]  Chief Complaint  Patient presents with   Follow-up    3 month f/u.  No concerns.     Medical Interpretor: Claiborne Billings [facility])  History of Present Illness:  Patient is in today for reassessment of chronic medical issues.  Mr. Weida has been on some intermittent Celebrex for chest wall pain. This came about after a cardiology assessment. He feels this is doing well.  After initiating the Celebrex, he had developed some symptoms of gastritis. He responded well to a course of Prilosec. he has not noted any ongoing abdominal pain.  Mr. Maxson has had complaints of frequent urination. He feels like he does drink quite a bit of water, but he had been getting up 4-5 times a night to urinate. His symptoms seemed most consistent with LUTS. I started him on Flomax at his last visit. He notes now he is only getting up 2 times a night and is pleased with the response.  Mr. Ginyard has a history of prediabetes and borderline hyperlipidemia.  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 30 tablet 0   omeprazole (PRILOSEC) 20 MG capsule Take 1 capsule (20 mg total) by mouth daily. 30 capsule 1   celecoxib (CELEBREX) 100 MG capsule Take 1 capsule (100 mg total) by mouth 2 (two) times daily. 28 capsule 0   tamsulosin (FLOMAX) 0.4 MG CAPS capsule Take 1 capsule (0.4 mg total) by mouth daily. 30 capsule 3   No facility-administered medications prior to visit.   No Known Allergies    Objective:   Today's Vitals   06/18/22 0811  BP: 124/68  Pulse: 69  Temp: (!) 97.4 F (36.3 C)  TempSrc: Temporal  SpO2: 97%  Weight: 112 lb 3.2 oz (50.9 kg)  Height: 5\' 3"  (1.6 m)   Body mass index is 19.88 kg/m.   General: Well developed, well nourished. No acute distress. Psych: Alert and oriented. Normal mood and affect.  Health Maintenance Due  Topic Date Due   HIV Screening  Never done   Hepatitis C Screening  Never done   DTaP/Tdap/Td (1 - Tdap) Never done   COLONOSCOPY (Pts 45-30yrs Insurance coverage will need to be confirmed)  Never done   Zoster Vaccines- Shingrix (1 of 2) Never done     Assessment & Plan:   Problem List Items Addressed This Visit       Digestive   Gastritis without bleeding    Improved after a course of Prilosec. I will try  stopping this medicine and see how he does. he shodul contact me if the epigastric symptoms return.        Other   Borderline hyperlipidemia - Primary    We will reassess lipids today.      Relevant Orders   Lipid panel   Prediabetes    We will check his annual A1c and glucose.      Relevant Orders   Hemoglobin A1c   Glucose, random   Lower urinary tract symptoms (LUTS)    Improved symptoms with decreased nocturia. Continue Flomax 0.4 mg daily.      Relevant Medications   tamsulosin (FLOMAX) 0.4 MG CAPS capsule   Chest pain of uncertain etiology    Improved. I will continue some PRN celecoxib.      Relevant Medications   celecoxib (CELEBREX) 100 MG capsule     Return in about 6 months (around 12/17/2022) for Reassessment.   Haydee Salter, MD

## 2022-06-18 NOTE — Assessment & Plan Note (Signed)
Improved. I will continue some PRN celecoxib.

## 2022-06-18 NOTE — Assessment & Plan Note (Signed)
We will check his annual A1c and glucose.

## 2022-06-18 NOTE — Assessment & Plan Note (Signed)
Improved symptoms with decreased nocturia. Continue Flomax 0.4 mg daily.

## 2022-06-18 NOTE — Assessment & Plan Note (Signed)
We will reassess lipids today. 

## 2022-06-18 NOTE — Assessment & Plan Note (Signed)
Improved after a course of Prilosec. I will try stopping this medicine and see how he does. he shodul contact me if the epigastric symptoms return.

## 2022-12-22 ENCOUNTER — Ambulatory Visit: Payer: Self-pay | Admitting: Family Medicine

## 2022-12-29 ENCOUNTER — Encounter: Payer: Self-pay | Admitting: Family Medicine

## 2022-12-29 ENCOUNTER — Ambulatory Visit (INDEPENDENT_AMBULATORY_CARE_PROVIDER_SITE_OTHER): Payer: Self-pay | Admitting: Family Medicine

## 2022-12-29 VITALS — BP 110/66 | HR 59 | Temp 98.7°F | Ht 63.0 in | Wt 106.2 lb

## 2022-12-29 DIAGNOSIS — R634 Abnormal weight loss: Secondary | ICD-10-CM

## 2022-12-29 DIAGNOSIS — R079 Chest pain, unspecified: Secondary | ICD-10-CM

## 2022-12-29 MED ORDER — CELECOXIB 100 MG PO CAPS: 100.0000 mg | ORAL_CAPSULE | Freq: Two times a day (BID) | ORAL | 2 refills | Status: DC

## 2022-12-29 NOTE — Progress Notes (Signed)
[facility] [address] [place] [address] Dept: [phone] Dept Fax: [phone]  Chronic Care Office Visit  Subjective:    Patient ID: Travis Knight, male    DOB: 1966/04/29, 57 y.o..   MRN: [id number]  Chief Complaint  Patient presents with   Follow-up    6 month f/u.  Not Fasting.  C/o having chest pains off/on with lifting heavy things.   Medical Interpretor: [place] Chung   History of Present Illness:  Patient is in today for reassessment of chronic medical issues.  Travis Knight has a history of chest wall pain. Prior cardiology assessment excluded a cardiac cause for this. He continues to have intermittent pain when he has been lifting heavy objects at work. 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 He uses Tylenol, which typically makes this better.  He had also used occasional celecoxib when needed, but has been out of this. He denies any of the priro gastritis symptoms he had been having.   Travis Knight has had issues with difficulty maintaining weight. He notes that he has been working harder and not eating as much. The interpretor points out that in Travis Knight culture, they often are concerned with drinking too much water, which makes them have to urinate more frequently.   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 30 tablet 0   tamsulosin (FLOMAX) 0.4 MG CAPS capsule Take 1 capsule (0.4 mg total) by mouth daily. 90 capsule 3   celecoxib (CELEBREX) 100 MG capsule Take 1 capsule (100 mg total) by mouth 2 (two) times daily. (Patient not taking: Reported on 12/29/2022) 28 capsule 0   No facility-administered medications prior to visit.   No Known Allergies Objective:   Today's Vitals   12/29/22 0804  BP: 110/66  Pulse: (!) 59  Temp: 98.7 F (37.1 C)  TempSrc: Temporal  SpO2: 100%  Weight: 106 lb 3.2 oz (48.2 kg)  Height: 5\' 3"  (1.6 m)   Body mass index is 18.81 kg/m.   General: Well developed, well nourished. No acute distress. CV: RRR without murmurs or rubs. Pulses 2+ bilaterally. Skin: Warm and dry. Some moderate loss of skin turgor. Psych: Alert and oriented. Normal mood and affect.  Health Maintenance Due  Topic Date Due   HIV Screening  Never done   Hepatitis C Screening  Never done   DTaP/Tdap/Td (1 - Tdap) Never done   Colonoscopy  Never done   Zoster Vaccines- Shingrix (1 of 2) Never done     Assessment & Plan:   Problem List Items Addressed This Visit       Other   Chest pain of uncertain etiology - Primary    Stable. He should use  Tylenol first-line for the chest pain. I will continue some PRN celecoxib for whne the Tylenol does not resolve his issues.      Relevant Medications   celecoxib (CELEBREX) 100 MG capsule   Other Visit Diagnoses     Weight loss       Appears mildly dehydrated. I urged Travis Knight to drink adequate water during these hot and humid months of the year.       Return in about 6 months (around 07/01/2023) for Reassessment.   Loyola Mast, MD

## 2022-12-29 NOTE — Assessment & Plan Note (Signed)
Stable. He should use Tylenol first-line for the chest pain. I will continue some PRN celecoxib for whne the Tylenol does not resolve his issues.

## 2023-04-20 ENCOUNTER — Emergency Department (HOSPITAL_COMMUNITY)
Admission: EM | Admit: 2023-04-20 | Discharge: 2023-04-20 | Disposition: A | Discharge status: Home / Self Care | Payer: No Typology Code available for payment source | Attending: Emergency Medicine | Admitting: Emergency Medicine

## 2023-04-20 ENCOUNTER — Other Ambulatory Visit (HOSPITAL_COMMUNITY): Payer: Self-pay

## 2023-04-20 ENCOUNTER — Other Ambulatory Visit: Payer: Self-pay

## 2023-04-20 DIAGNOSIS — R55 Syncope and collapse: Secondary | ICD-10-CM | POA: Diagnosis not present

## 2023-04-20 DIAGNOSIS — R42 Dizziness and giddiness: Secondary | ICD-10-CM | POA: Diagnosis present

## 2023-04-20 DIAGNOSIS — H81393 Other peripheral vertigo, bilateral: Secondary | ICD-10-CM | POA: Insufficient documentation

## 2023-04-20 MED ORDER — MECLIZINE HCL 25 MG PO TABS
12.5000 mg | ORAL_TABLET | Freq: Three times a day (TID) | ORAL | 0 refills | Status: AC | PRN
  Filled 2023-04-20: qty 15, 10d supply, fill #0

## 2023-04-20 NOTE — ED Provider Notes (Addendum)
[place] EMERGENCY DEPARTMENT AT [facility] Provider Note   CSN: [id number] Arrival date & time: 04/20/23  9562     History  Chief Complaint  Patient presents with   Loss of Consciousness    Travis Knight is a 57 y.o. male with unknown past medical history besides open abdominal surgery due to perforated bowel presented for syncopal episode that occurred prior to arrival.  Patient was at a coffee shop as per him and the daughter in which he sat down and immediately felt dizzy and reportedly flex both elbows hands were inward and fell over.  Patient did not hit his head and denies any headache or vision changes.  Patient states he has had on and off tinnitus for a while but denies any recent illnesses, hematuria, dysuria, chest pain, shortness of breath, medication changes, fevers, headache, neck pain.  Patient only takes Tylenol or ibuprofen along with tamsulosin but states that there have been no medication changes and he takes tamsulosin for his heart.  Patient states that he did lose consciousness in which he felt a curtain going over both of his eyes for a brief moment.  Patient's dizziness was nonpositional and states that since then he is been fine.  Patient did have 1 cup of coffee before hand which she states is normal for him.  [place] translator [phone]  Home Medications Prior to Admission medications   Medication Sig Start Date End Date Taking? Authorizing Provider  meclizine (ANTIVERT) 25 MG tablet Take 0.5 tablets (12.5 mg total) by mouth 3 (three) times daily as needed for dizziness. 04/20/23  Yes Netta Corrigan, PA-C      Allergies    Patient has no allergy information on record.    Review of Systems   Review of Systems  Physical Exam Updated Vital Signs BP 118/62 (BP Location: Left Arm)   Pulse (!) 52   Temp 98.7 F (37.1 C) (Oral)   Resp (!) 9   SpO2 99%  Physical Exam Constitutional:      General: He is not in acute distress. HENT:      Right Ear: Ear canal and external ear normal.     Left Ear: Ear canal and external ear normal.     Ears:     Comments: Fluid noted behind both tympanic membranes    Nose: Nose normal.     Mouth/Throat:     Mouth: Mucous membranes are moist.  Eyes:     Extraocular Movements: Extraocular movements intact.     Conjunctiva/sclera: Conjunctivae normal.     Pupils: Pupils are equal, round, and reactive to light.  Cardiovascular:     Rate and Rhythm: Normal rate and regular rhythm.     Pulses: Normal pulses.     Heart sounds: Normal heart sounds.  Pulmonary:     Effort: Pulmonary effort is normal. No respiratory distress.     Breath sounds: Normal breath sounds.  Abdominal:     Palpations: Abdomen is soft.     Tenderness: There is no abdominal tenderness. There is no guarding.     Comments: Surgical scar noted  Skin:    General: Skin is warm and dry.     Capillary Refill: Capillary refill takes less than 2 seconds.  Neurological:     Mental Status: He is alert and oriented to person, place, and time.     Comments: Head impulse test: Patient does have a corrective saccade to the midline on both sides. Nystagmus: Patient  does not have nystagmus Test of skew: No skew Cranial nerves III through XII intact Vision grossly intact  Psychiatric:        Mood and Affect: Mood normal.     ED Results / Procedures / Treatments   Labs (all labs ordered are listed, but only abnormal results are displayed) Labs Reviewed  COMPREHENSIVE METABOLIC PANEL - Abnormal; Notable for the following components:      Result Value   Glucose, Bld 101 (*)    All other components within normal limits  URINALYSIS, ROUTINE W REFLEX MICROSCOPIC - Abnormal; Notable for the following components:   Color, Urine AMBER (*)    APPearance CLOUDY (*)    Specific Gravity, Urine 1.036 (*)    Protein, ur 100 (*)    All other components within normal limits  LACTIC ACID, PLASMA  CBC WITH DIFFERENTIAL/PLATELET  CBC WITH  DIFFERENTIAL/PLATELET  CBG MONITORING, ED  CBG MONITORING, ED  TROPONIN I (HIGH SENSITIVITY)  TROPONIN I (HIGH SENSITIVITY)    EKG EKG Interpretation Date/Time:  Wednesday April 20 2023 10:03:35 EST Ventricular Rate:  55 PR Interval:    QRS Duration:  101 QT Interval:  439 QTC Calculation: 420 R Axis:   94  Text Interpretation: Normal sinus rhythm Artifact in lead(s) I II III aVR aVL aVF V1 V2 V5 Confirmed by Vonita Moss [phone]) on 04/20/2023 10:24:06 AM  Radiology DG Chest 2 View  Result Date: 04/20/2023 CLINICAL DATA:  Syncope. EXAM: CHEST - 2 VIEW COMPARISON:  None Available. FINDINGS: The heart size and mediastinal contours are within normal limits. Hyperinflation of the lungs is noted. Left suprahilar and apical nodular densities are noted. The visualized skeletal structures are unremarkable. IMPRESSION: Left suprahilar and apical nodular densities are noted. CT scan is recommended for further evaluation. Electronically Signed   By: Lupita Raider M.D.   On: 04/20/2023 11:52   CT Head Wo Contrast  Result Date: 04/20/2023 CLINICAL DATA:  History: Loss of consciousness.  Syncopal episode. EXAM: CT HEAD WITHOUT CONTRAST TECHNIQUE: Contiguous axial images were obtained from the base of the skull through the vertex without intravenous contrast. RADIATION DOSE REDUCTION: This exam was performed according to the departmental dose-optimization program which includes automated exposure control, adjustment of the mA and/or kV according to patient size and/or use of iterative reconstruction technique. COMPARISON:  None. FINDINGS: Brain: Mild generalized cerebral atrophy. There is no acute intracranial hemorrhage. No demarcated cortical infarct. No extra-axial fluid collection. No evidence of an intracranial mass. No midline shift. Vascular: No hyperdense vessel.  Atherosclerotic calcifications. Skull: No calvarial fracture or aggressive osseous lesion. Sinuses/Orbits: No mass or acute  finding within the imaged orbits. Mild mucosal thickening within the bilateral frontal and ethmoid sinuses. Other: Chronic nasal, right orbital and right facial fracture deformities at the imaged levels (some incompletely imaged). IMPRESSION: 1.  No evidence of an acute intracranial abnormality. 2. Mild generalized cerebral atrophy. 3. Mild mucosal thickening within the bilateral frontal and ethmoid sinuses. Electronically Signed   By: Jackey Loge D.O.   On: 04/20/2023 11:43    Procedures Procedures    Medications Ordered in ED Medications - No data to display  ED Course/ Medical Decision Making/ A&P                                 Medical Decision Making Amount and/or Complexity of Data Reviewed Labs: ordered. Radiology: ordered.   [employer] 57  y.o. presented today for dizziness. Working DDx that I considered at this time includes, but not limited to, CVA/TIA, arrhythmia, CNS lesion, BPPV, vestibular labyrinthitis, Meniere's, Ramsey-Hunt, polypharmacy, orthostatic hypotension, electrolyte abnormalities.  R/o DDx: Pending  Review of prior external notes: none  Unique Tests and My Interpretation:  EKG: Sinus 55 bpm, no previous to compare, no ST elevations or depressions noted, artifact present CBC: Unremarkable BMP: Unremarkable UA: Unremarkable CT Head w/o Contrast: No acute findings Chest x-ray: No acute findings CBG: 89 Lactic acid: 1.1 Troponin: 3,  Social Determinants of Health: none  Discussion with Independent Historian:  Daughter  Discussion of Management of Tests: None  Risk: Medium: prescription drug management  Risk Stratification Score: None  Staffed with Eloise Harman, MD  Plan: On exam patient was in no acute distress with stable vitals.  Patient's physical exam including neurologic exam was reassuring.  I did note bilateral fluid behind tympanic membranes and patient was endorsing some intermittent tinnitus that could be contributing to patient's  dizziness however would not expect this to cause syncope.  Patient's history in the chart is unknown and patient states he has some heart disease but is unsure of what it is.  Patient is on tamsulosin for his heart but no other prescription medications.  Will obtain broad workup as it is unclear as to what the patient's past medical history is however do suspect peripheral vertigo given physical exam and intermittent tinnitus.  Patient is not currently dizzy or requiring any medications.  Patient's labs and imaging are all reassuring.  Currently awaiting delta troponin and will ambulate the patient to see if patient remained steady.  Do feel this is peripheral vertigo and will also prescribe meclizine for this and have him follow-up with ENT.  Patient's been able to ambulate without difficulty and so once delta troponin is back patient can be discharged.  Patient signed out to [place], [place].  Please review their note for the continuation of patient's care.  The plan at this point is discharge if delta troponin is negative with ENT follow-up.  This chart was dictated using voice recognition software.  Despite best efforts to proofread,  errors can occur which can change the documentation meaning.         Final Clinical Impression(s) / ED Diagnoses Final diagnoses:  Peripheral vertigo, bilateral    Rx / DC Orders ED Discharge Orders          Ordered    meclizine (ANTIVERT) 25 MG tablet  3 times daily PRN        04/20/23 1359              Netta Corrigan, PA-C 04/20/23 1515    Netta Corrigan, PA-C 04/20/23 1535    Rondel Baton, MD 04/23/23 [phone]

## 2023-04-21 ENCOUNTER — Other Ambulatory Visit (HOSPITAL_COMMUNITY): Payer: Self-pay

## 2023-04-22 ENCOUNTER — Other Ambulatory Visit: Payer: Self-pay

## 2023-06-03 ENCOUNTER — Telehealth: Payer: Self-pay | Admitting: Family Medicine

## 2023-06-03 NOTE — Telephone Encounter (Signed)
 ERROR

## 2023-06-10 ENCOUNTER — Telehealth: Payer: Self-pay | Admitting: Family Medicine

## 2023-06-10 NOTE — Telephone Encounter (Signed)
error

## 2023-06-15 ENCOUNTER — Other Ambulatory Visit: Payer: Self-pay | Admitting: Family Medicine

## 2023-06-15 DIAGNOSIS — R399 Unspecified symptoms and signs involving the genitourinary system: Secondary | ICD-10-CM

## 2023-07-06 ENCOUNTER — Ambulatory Visit: Payer: Self-pay | Admitting: Family Medicine

## 2023-08-10 ENCOUNTER — Ambulatory Visit (INDEPENDENT_AMBULATORY_CARE_PROVIDER_SITE_OTHER): Payer: Self-pay | Admitting: Family Medicine

## 2023-08-10 ENCOUNTER — Encounter: Payer: Self-pay | Admitting: Family Medicine

## 2023-08-10 VITALS — BP 110/66 | HR 60 | Temp 98.2°F | Ht 63.0 in | Wt 106.4 lb

## 2023-08-10 DIAGNOSIS — F5101 Primary insomnia: Secondary | ICD-10-CM | POA: Diagnosis not present

## 2023-08-10 DIAGNOSIS — Z1211 Encounter for screening for malignant neoplasm of colon: Secondary | ICD-10-CM

## 2023-08-10 DIAGNOSIS — K2970 Gastritis, unspecified, without bleeding: Secondary | ICD-10-CM | POA: Diagnosis not present

## 2023-08-10 DIAGNOSIS — Z114 Encounter for screening for human immunodeficiency virus [HIV]: Secondary | ICD-10-CM | POA: Diagnosis not present

## 2023-08-10 DIAGNOSIS — R7303 Prediabetes: Secondary | ICD-10-CM | POA: Diagnosis not present

## 2023-08-10 DIAGNOSIS — Z1159 Encounter for screening for other viral diseases: Secondary | ICD-10-CM

## 2023-08-10 DIAGNOSIS — E785 Hyperlipidemia, unspecified: Secondary | ICD-10-CM | POA: Diagnosis not present

## 2023-08-10 DIAGNOSIS — R627 Adult failure to thrive: Secondary | ICD-10-CM | POA: Diagnosis not present

## 2023-08-10 LAB — HEMOGLOBIN A1C: Hgb A1c MFr Bld: 6 % (ref 4.6–6.5)

## 2023-08-10 LAB — LIPID PANEL
Cholesterol: 217 mg/dL — ABNORMAL HIGH (ref 0–200)
HDL: 68.7 mg/dL (ref >39.00)
LDL Cholesterol: 127 mg/dL — ABNORMAL HIGH (ref 0–99)
NonHDL: 147.86
Total CHOL/HDL Ratio: 3
Triglycerides: 103 mg/dL (ref 0.0–149.0)
VLDL: 20.6 mg/dL (ref 0.0–40.0)

## 2023-08-10 LAB — CBC
HCT: 42.4 % (ref 39.0–52.0)
Hemoglobin: 14.2 g/dL (ref 13.0–17.0)
MCHC: 33.5 g/dL (ref 30.0–36.0)
MCV: 93.4 fl (ref 78.0–100.0)
Platelets: 250 10*3/uL (ref 150.0–400.0)
RBC: 4.54 Mil/uL (ref 4.22–5.81)
RDW: 13.7 % (ref 11.5–15.5)
WBC: 4.6 10*3/uL (ref 4.0–10.5)

## 2023-08-10 LAB — BASIC METABOLIC PANEL
BUN: 14 mg/dL (ref 6–23)
CO2: 31 meq/L (ref 19–32)
Calcium: 9.5 mg/dL (ref 8.4–10.5)
Chloride: 101 meq/L (ref 96–112)
Creatinine, Ser: 1.02 mg/dL (ref 0.40–1.50)
GFR: 81.62 mL/min (ref >60.00)
Glucose, Bld: 87 mg/dL (ref 70–99)
Potassium: 4 meq/L (ref 3.5–5.1)
Sodium: 138 meq/L (ref 135–145)

## 2023-08-10 NOTE — Assessment & Plan Note (Signed)
 Appears resolved. Due to prior complaints and low BMI, I will check a CBC to make sure there is no anemia.

## 2023-08-10 NOTE — Assessment & Plan Note (Signed)
We will check his annual A1c and glucose.

## 2023-08-10 NOTE — Patient Instructions (Signed)
-   Try using melatonin for sleep

## 2023-08-10 NOTE — Progress Notes (Signed)
 [facility]ARE [facility] [address] [place] [address] Dept: [phone] Dept Fax: [phone]  Chronic Care Office Visit  Subjective:    Patient ID: Travis Knight, male    DOB: Sep 20, 1965, 58 y.o..   MRN: [id number]  Chief Complaint  Patient presents with   Follow-up    6 month f/u. C/o having sleeping issues.    Medical Interpreter: Tresa Endo  History of Present Illness:  Patient is in today for reassessment of chronic medical issues.  Travis Knight had previously been seeing me regarding chest pain. He notes this has resolved. He had been taking celecoxib for this, but notes he has not needed this in some time now.  Travis Knight has had past issues with gastritis and complaints of an inability to gain weight. It was thought he might have gastritis, but he does note note any symptoms of this anymore. Prior work-up has not shown an issue with weight loss and no underlying cause.  Travis Knight notes he has difficulty with maintaining sleep. He falls asleep okay, but if he awakens at night to urinate, he finds he cannot get back to sleep. He drinks multiple cups of coffee throughout the day. He has not tried anything in particular to help with his sleep.  Travis Knight has a history of borderline hyperlipidemia and prediabetes.  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 30 tablet 0   meclizine (ANTIVERT) 25 MG tablet Take 0.5 tablets (12.5 mg total) by mouth 3 (three) times daily as needed for dizziness. (Patient not taking: Reported on 08/10/2023) 15 tablet 0   tamsulosin (FLOMAX) 0.4 MG CAPS capsule Take 1 capsule by mouth once daily (Patient not taking: Reported on 08/10/2023) 90 capsule 3   celecoxib (CELEBREX) 100 MG capsule Take 1 capsule (100 mg total) by mouth 2 (two) times daily. 30 capsule 2   No facility-administered medications prior to visit.   No Known Allergies Objective:   Today's Vitals   08/10/23 0759  BP: 110/66  Pulse: 60  Temp: 98.2 F (36.8 C)  TempSrc: Temporal  SpO2: 100%  Weight: 106 lb 6.4 oz (48.3 kg)  Height: 5\' 3"  (1.6 m)   Body mass index is 18.85 kg/m.   General: Well developed, well nourished. No acute distress. CV: RRR without murmurs or rubs. Pulses 2+ bilaterally. Abdomen: Soft, non-tender. No hepatosplenomegaly. No rebound or guarding. Psych: Alert and oriented. Normal mood and affect.  Health Maintenance Due  Topic Date Due   Pneumococcal Vaccine 47-37 Years old (1 of 2 - PCV) Never done   HIV Screening  Never done   Hepatitis C Screening  Never done   DTaP/Tdap/Td (1 - Tdap) Never done   Colonoscopy  Never done  Zoster Vaccines- Shingrix (1 of 2) Never done     Assessment & Plan:   Problem List Items Addressed This Visit       Digestive   Gastritis without bleeding   Appears resolved. Due to prior complaints and low BMI, I will check a CBC to make sure there is no anemia.      Relevant Orders   CBC     Other   Borderline hyperlipidemia   We will reassess lipids today.      Relevant Orders   Lipid panel   Poor weight gain in adult   Remains with a low BMI. No sign of weight loss.      Prediabetes - Primary   We will check his annual A1c and glucose.      Relevant Orders    Basic metabolic panel   Hemoglobin A1c   Primary insomnia   This may be multifactorial. Travis Knight has had LUTS in the past and is no longer taking his tamsulosin. This could certainly be causing him to awaken at night to urinate. I recommend he reduce caffeine intake. I advised for him to try melatonin or chamomile tea at bedtime to help with sleep. He asks about a prescription medicine for this. The interpreter notes that culturally, he does not feel an OTC will help, only a prescription medicine. I encouraged him to give this a try first.      Other Visit Diagnoses       Encounter for hepatitis C screening test for low risk patient       Relevant Orders   HCV Ab w Reflex to Quant PCR     Screening for HIV (human immunodeficiency virus)       Relevant Orders   HIV Antibody (routine testing w rflx)     Screening for colon cancer       Relevant Orders   Ambulatory referral to Gastroenterology       Return in about 6 months (around 02/10/2024) for Reassessment.   Loyola Mast, MD

## 2023-08-10 NOTE — Assessment & Plan Note (Signed)
 This may be multifactorial. Mr. Fults has had LUTS in the past and is no longer taking his tamsulosin. This could certainly be causing him to awaken at night to urinate. I recommend he reduce caffeine intake. I advised for him to try melatonin or chamomile tea at bedtime to help with sleep. He asks about a prescription medicine for this. The interpreter notes that culturally, he does not feel an OTC will help, only a prescription medicine. I encouraged him to give this a try first.

## 2023-08-10 NOTE — Assessment & Plan Note (Signed)
 Remains with a low BMI. No sign of weight loss.

## 2023-08-10 NOTE — Assessment & Plan Note (Signed)
We will reassess lipids today. 

## 2023-08-11 LAB — HIV ANTIBODY (ROUTINE TESTING W REFLEX): HIV 1&2 Ab, 4th Generation: NONREACTIVE

## 2023-08-12 LAB — HCV AB W REFLEX TO QUANT PCR: HCV Ab: NONREACTIVE

## 2023-08-12 LAB — HCV INTERPRETATION

## 2023-08-24 ENCOUNTER — Encounter: Payer: Self-pay | Admitting: Family Medicine

## 2023-09-07 ENCOUNTER — Encounter: Payer: Self-pay | Admitting: Family Medicine

## 2024-02-15 ENCOUNTER — Encounter: Payer: Self-pay | Admitting: Family Medicine

## 2024-02-15 ENCOUNTER — Ambulatory Visit (INDEPENDENT_AMBULATORY_CARE_PROVIDER_SITE_OTHER): Admitting: Family Medicine

## 2024-02-15 VITALS — BP 112/68 | HR 65 | Temp 97.6°F | Ht 63.0 in | Wt 105.8 lb

## 2024-02-15 DIAGNOSIS — F5101 Primary insomnia: Secondary | ICD-10-CM | POA: Diagnosis not present

## 2024-02-15 DIAGNOSIS — R627 Adult failure to thrive: Secondary | ICD-10-CM

## 2024-02-15 DIAGNOSIS — K2970 Gastritis, unspecified, without bleeding: Secondary | ICD-10-CM | POA: Diagnosis not present

## 2024-02-15 DIAGNOSIS — Z1211 Encounter for screening for malignant neoplasm of colon: Secondary | ICD-10-CM

## 2024-02-15 DIAGNOSIS — Z23 Encounter for immunization: Secondary | ICD-10-CM

## 2024-02-15 MED ORDER — HYDROXYZINE PAMOATE 50 MG PO CAPS: 50.0000 mg | ORAL_CAPSULE | Freq: Every evening | ORAL | 0 refills | Status: DC | PRN

## 2024-02-15 NOTE — Assessment & Plan Note (Signed)
 Remains with a low BMI. No sign of weight loss. Prior lab evaluation has been normal.

## 2024-02-15 NOTE — Progress Notes (Signed)
 [facility]ARE [facility] [address] [place] [address] Dept: [phone] Dept Fax: [phone]  Chronic Care Office Visit  Subjective:    Patient ID: Travis Knight, male    DOB: [birth year], 58 y.o..   MRN: [id number]  Chief Complaint  Patient presents with   Diabetes    6 month f/u DM.  Co having urine frequency at night.  No fasting today.  Flu shot today.   History of Present Illness:  Patient is in today for reassessment of chronic medical issues.  Mr. Coury has had past issues with gastritis and complaints of an inability to gain weight. It was thought he might have gastritis, but he does not note any symptoms of this currently. Prior work-up has not shown an issue with weight loss and no underlying cause. I had referred him for colonoscopy, but this was never completed.   Mr. Grinder notes he has ongoing difficulty with maintaining sleep. He falls asleep okay, but if he awakens at night to urinate, he finds he cannot get back to sleep. He has decreased his coffee intake to 1 cup in the morning. He is currently on tamsulosin  to see if this will reduce his nocturia. He ahs not noted any improvement.  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 30 tablet 0   meclizine (ANTIVERT) 25 MG tablet Take 0.5 tablets (12.5 mg total) by mouth 3 (three) times daily as needed for dizziness. 15 tablet 0   tamsulosin  (FLOMAX ) 0.4 MG CAPS capsule Take 1 capsule by mouth once daily 90 capsule 3   No facility-administered medications prior to visit.   No Known Allergies Objective:   Today's Vitals   02/15/24 0821  BP: 112/68  Pulse: 65  Temp: 97.6 F (36.4 C)  TempSrc: Temporal  SpO2: 98%  Weight: 105 lb 12.8 oz (48 kg)  Height: 5' 3 (1.6 m)   Body mass index is 18.74 kg/m.   General: Well developed, well nourished. No acute distress. CV: RRR without murmurs or rubs. Pulses 2+ bilaterally.  Health Maintenance Due  Topic Date Due   DTaP/Tdap/Td (1 - Tdap) Never done   Pneumococcal Vaccine: 50+ Years (1 of 2 - PCV) Never done   Hepatitis B Vaccines 19-59 Average Risk (1 of 3 - 19+ 3-dose series) Never done   Colonoscopy  Never done   Zoster Vaccines- Shingrix (1 of 2) Never done   Influenza Vaccine  12/16/2023     Assessment & Plan:   Problem List Items Addressed This Visit       Digestive   Gastritis without bleeding   Appears resolved. It is unclear if this was ever an issue, but he does have a low  BMI. He may benefit from a EGD to assess further.      Relevant Orders   AMB Referral VBCI Care Management     Other   Poor weight gain in adult   Remains with a low BMI. No sign of weight loss. Prior lab evaluation has been normal.      Relevant Orders   AMB Referral VBCI Care Management   Primary insomnia - Primary   This may be multifactorial. Mr. Carcione has failed at other OTC options. I will try him on hydroxyzine to help sleep and possibly reduce his nighttime urination.      Relevant Medications   hydrOXYzine (VISTARIL) 50 MG capsule   Other Visit Diagnoses       Screening for colon cancer       Appears GI reached out, but could not connect. They sent a letter in [place], which he  cannot read. I will ask VBCI team to assist him with coordinating this.   Relevant Orders   AMB Referral VBCI Care Management     Need for immunization against influenza       Relevant Orders   Flu vaccine HIGH DOSE PF(Fluzone Trivalent)     Need for pneumococcal 20-valent conjugate vaccination       Relevant Orders   Pneumococcal conjugate vaccine 20-valent       Return in about 6 months (around 08/15/2024) for Reassessment.   Garnette CHRISTELLA Simpler, MD

## 2024-02-15 NOTE — Assessment & Plan Note (Signed)
 This may be multifactorial. Mr. Travis Knight has failed at other OTC options. I will try him on hydroxyzine to help sleep and possibly reduce his nighttime urination.

## 2024-02-15 NOTE — Assessment & Plan Note (Signed)
 Appears resolved. It is unclear if this was ever an issue, but he does have a low BMI. He may benefit from a EGD to assess further.

## 2024-02-17 ENCOUNTER — Telehealth: Payer: Self-pay

## 2024-02-17 NOTE — Progress Notes (Signed)
 error

## 2024-02-17 NOTE — Progress Notes (Signed)
 Complex Care Management Note  Care Guide Note 02/17/2024 Name: Travis Knight MRN: [id number] DOB: September 01, 1965  Travis Knight is a 58 y.o. year old male who sees Rudd, Garnette HERO, MD for primary care. I reached out to [employer] by phone today to offer complex care management services.  [name] was given information about Complex Care Management services today including:   The Complex Care Management services include support from the care team which includes your Nurse Care Manager, Clinical Social Worker, or Pharmacist.  The Complex Care Management team is here to help remove barriers to the health concerns and goals most important to you. Complex Care Management services are voluntary, and the patient may decline or stop services at any time by request to their care team member.   Complex Care Management Consent Status: Patient agreed to services and verbal consent obtained.   Follow up plan:  Telephone appointment with complex care management team member scheduled for:  03/05/2024  Encounter Outcome:  Patient Scheduled  Travis Knight , RMA     [place]  [facility], [facility] Guide  Direct Dial: [phone]  Website: [email or web address]

## 2024-03-05 ENCOUNTER — Telehealth: Payer: Self-pay

## 2024-03-05 NOTE — Patient Instructions (Signed)
 Kh?c Thnh V? - Ti xin l?i v hm nay ti ? khng th? lin l?c v?i b?n theo l?ch h?n ? ??nh. Ti lm vi?c cng bc s? Garnette EMERSON Simpler v ?ang g?i ?? h? tr? cc nhu c?u ch?m [place] s?c kh?e c?a b?n. Vui lng lin h? v?i ti theo s? [phone] vo th?i gian thu?n ti?n nh?t cho b?n. Ti mong s?m ???c tr chuy?n cng b?n.  Trn tr?ng, Heddy Shutter, RN, MSN, BSN, CCM [place]  Vi?n Ch?m [place] D?a trn Gi tr?, S?c kh?e Dn s? Qu?n l h? s? b?nh nhn ?i?n tho?i: [phone]    Janise Harry Szwed - I am sorry I was unable to reach you today for our scheduled appointment. I work with Simpler, Garnette HERO, MD and am calling to support your healthcare needs. Please contact me at [phone] at your earliest convenience. I look forward to speaking with you soon.   Thank you,  Heddy Shutter, RN, MSN, BSN, CCM [place]  [facility], Population Health Case Manager Phone: [phone]

## 2024-03-05 NOTE — Patient Outreach (Signed)
 Complex Care Management Care Guide Note  03/05/2024 Name: Travis Knight MRN: [id number] DOB: February 06, 1966  Travis Knight is a 58 y.o. year old male who is a primary care patient of Thedora Garnette HERO, MD and is actively engaged with the care management team. Janise Murtis Benham called by phone today to re-scheduling  with the RN Case Manager after missed appointment.  Follow up plan: Telephone appointment with complex care management team member scheduled for:  03/19/24  Shereen Gin [facility]  Population Health VBCI Assistant Direct Dial: [phone]  Fax: [phone] Website: [email or web address]

## 2024-03-12 ENCOUNTER — Other Ambulatory Visit: Payer: Self-pay | Admitting: Family Medicine

## 2024-03-12 DIAGNOSIS — F5101 Primary insomnia: Secondary | ICD-10-CM

## 2024-03-12 NOTE — Telephone Encounter (Signed)
 Refill request for  Hydroxizine 50 mg LR 02/15/24, #30, 0 rf LOV  02/15/24 FOV  08/15/24  Please review and advise.  Thanks. Dm/cma

## 2024-03-19 ENCOUNTER — Other Ambulatory Visit: Payer: Self-pay

## 2024-03-19 ENCOUNTER — Encounter: Payer: Self-pay | Admitting: Internal Medicine

## 2024-03-19 NOTE — Patient Instructions (Signed)
 Thng Tin Cu?c H?n C?m ?n b?n ? dnh th?i gian g?p ti hm nay. Vui lng ??ng ng?n ng?i lin h? v?i ti n?u ti c th? h? tr? b?n tr??c cu?c h?n ti?p theo.  Cu?c h?n ti?p theo c?a chng ta s? di?n ra qua ?i?n tho?i vo ngy 18/03/2024 lc 2:30 chi?u. N?u b?n c?n h?y ho?c d?i l?i cu?c h?n, vui lng g?i nhm h??ng d?n ch?m [place] theo s?: [phone]. Sau ?y l b?n sao k? ho?ch ch?m [place] c?a b?n: M?c tiu ? ???c ?? c?p  Ti?n tri?n trong l?n th?m khm ny  K? ho?ch ch?m [place] c?a ?i?u d??ng RN VBCI V?n ??:  Nhu c?u ph?i h?p ch?m [place] lin quan ??n ro c?n ngn ng? / c?n h? tr? ln l?ch n?i soi ??i trng M?c tiu:  Trong vng 60 ngy t?i, b?nh nhn s? ti?p t?c lm vi?c v?i ?i?u d??ng Qu?n l Ch?m [place] v/ho?c Nhn vin x h?i ?? gi?i quy?t cc nhu c?u qu?n l v ph?i h?p ch?m [place] lin quan ??n vi?c ln l?ch n?i soi ??i trng, ???c ch?ng minh qua vi?c tun th? cc cu?c h?n ? ln l?ch v?i nhm qu?n l ch?m [place]. Can thi?p: Can thi?p duy tr s?c kh?e:  H? tr? ln l?ch n?i soi ??i trng cho b?nh nhn b?ng cch s? d?ng thng d?ch vin v ch? du; b?nh nhn ? ???c ln l?ch khm tr??c vo ngy 25/03/2024 lc 2:00 chi?u v n?i soi ??i trng vo ngy 01/27/2024 lc 7:30 sng.  Bc s? ch?m [place] chnh (PCP) ? ???c c?p nh?t Ho?t ??ng t? ch?m [place] c?a b?nh nhn:  Tham d? t?t c? cc cu?c h?n v?i nh cung c?p d?ch v?  G?i v?n phng bc s? n?u c m?i quan tm ho?c cu h?i m?i  T? th?c hi?n t?t c? cc ho?t ??ng ch?m [place] c nhn  U?ng thu?c theo ch? ??nh K? ho?ch:  Cu?c h?n theo di qua ?i?n tho?i v?i thnh vin nhm qu?n l ch?m [place] ? ???c ln l?ch vo: 18/03/2024 lc 2:30 chi?u  N?u b?n ?ang g?p kh?ng ho?ng v? s?c kh?e tm th?n ho?c hnh vi, ho?c c?n ai ? ?? tr chuy?n, vui lng g?i:  ???ng dy h? tr? kh?ng ho?ng v t? t?: [phone]  ???ng dy qu?c gia phng ch?ng t? t? [place] POUR?: [phone] ho?c TTY: [phone]  B?nh nhn ? di?n ??t s? hi?u bi?t v? k? ho?ch ch?m [place] v h??ng d?n ???c truy?n ??t trong l?n  th?m khm ny.  Heddy Shutter, RN, MSN, BSN, CCM [place]  Vi?n Ch?m  D?a Trn Gi Tr?, S?c Kh?e Dn S? Qu?n l h? s? b?nh nhn ?i?n tho?i: [phone]          Visit Information  Thank you for taking time to visit with me today. Please don't hesitate to contact me if I can be of assistance to you before our next scheduled appointment.  Our next appointment is by telephone on 04/03/24 at 2:30 pm Please call the care guide team at [phone] if you need to cancel or reschedule your appointment.   Following is a copy of your care plan:   Goals Addressed             This Visit's Progress    VBCI RN Care Plan       Problems:  Care Coordination needs related to Language barrier/needs assistance with scheduling Colonoscopy  Goal: Over the next 60 days the Patient will continue to work  with RN Care Manager and/or Social Worker to address care management and care coordination needs related to getting scheduled for Colonoscopy as evidenced by adherence to care management team scheduled appointments      Interventions:  Health Maintenance Interventions: Assisted with scheduling patient for Colonoscopy using interpreter and sister in law, patient scheduled for pre-visit on 04/10/24 at 2:00pm and colonoscopy scheduled for 04/24/24 at 7:30 am. PCP updated  Patient Self-Care Activities:  Attend all scheduled provider appointments Call provider office for new concerns or questions  Perform all self care activities independently  Take medications as prescribed    Plan:  Telephone follow up appointment with care management team member scheduled for:  04/03/24 at 2:30 pm      Please call the Suicide and Crisis Lifeline: 988 call the [place]  National Suicide Prevention Lifeline: 910-491-0430 or TTY: (614)387-7405 TTY [phone]) to talk to a trained counselor if you are experiencing a Mental Health or Behavioral Health Crisis or need someone to talk to.  Patient  verbalized understanding of Care plan and visit instructions communicated this visit  Heddy Shutter, RN, MSN, BSN, CCM [place]  [facility], Population Health Case Manager Phone: [phone]

## 2024-03-19 NOTE — Patient Outreach (Signed)
 Complex Care Management   Visit Note  03/19/2024  Name:  Travis Knight MRN: [id number] DOB: 09-24-1965  [employer]pre[employer] ID #[id number](Vietnamese) Situation: Referral received for Complex Care Management related to care coordination to assist with colonoscopy scheduling I obtained verbal consent from Patient  And Travis Knight(sister in law) with patient permission.  Visit completed with Patient and Travis Knight(sister in law)  on the phone  Background:  No past medical history on file.  Assessment: Patient reports the only medication he is taking now is hydroxyzine for sleep. He reports he will take up to 1-2 tablets at night for sleep. Patient Reported Symptoms:  Cognitive Cognitive Status: No symptoms reported      Neurological Neurological Review of Symptoms: No symptoms reported    HEENT HEENT Symptoms Reported: No symptoms reported      Cardiovascular Cardiovascular Symptoms Reported: No symptoms reported    Respiratory Respiratory Symptoms Reported: No symptoms reported    Endocrine Endocrine Symptoms Reported: No symptoms reported Is patient diabetic?: No    Gastrointestinal Gastrointestinal Symptoms Reported: No symptoms reported Additional Gastrointestinal Details: per patient, it's normal. he denies any GI symptoms, reports no problems with appetite. He does not check weight. last office visit weight was 105lb 12.8  oz on 02/15/24.      Genitourinary Genitourinary Symptoms Reported: No symptoms reported    Integumentary Integumentary Symptoms Reported: No symptoms reported    Musculoskeletal Musculoskelatal Symptoms Reviewed: No symptoms reported        Psychosocial Psychosocial Symptoms Reported: No symptoms reported     Quality of Family Relationships: helpful   There were no vitals filed for this visit.  Medications Reviewed Today     Reviewed by Tynika Luddy M, RN (Registered Nurse) on 03/19/24 at 1544  Med List Status: <None>    Medication Order Taking? Sig Documenting Provider Last Dose Status Informant  acetaminophen  (TYLENOL ) 500 MG tablet [id number] Yes Take 1 tablet (500 mg total) by mouth every 6 (six) hours as needed. Patt Alm Macho, MD  Active   hydrOXYzine (VISTARIL) 50 MG capsule [id number] Yes TAKE 1 CAPSULE BY MOUTH AT BEDTIME AS NEEDED FOR SLEEP Thedora Garnette HERO, MD  Active   meclizine (ANTIVERT) 25 MG tablet [id number]  Take 0.5 tablets (12.5 mg total) by mouth 3 (three) times daily as needed for dizziness.  Patient not taking: Reported on 03/19/2024   Victor Agent T, PA-C  Active   tamsulosin  (FLOMAX ) 0.4 MG CAPS capsule [id number]  Take 1 capsule by mouth once daily  Patient not taking: Reported on 03/19/2024   Thedora Garnette HERO, MD  Active           Recommendation:   04/10/24 at 2:00 pm GI pre-visit and 04/24/24 7:30 am colonoscopy RNCM will update PCP  Follow Up Plan:   Telephone follow up appointment date/time:  04/03/24 at 2:30 pm  Heddy Shutter, RN, MSN, BSN, CCM [place]  [facility], Population Health Case Manager Phone: [phone]

## 2024-04-03 ENCOUNTER — Other Ambulatory Visit: Payer: Self-pay

## 2024-04-03 NOTE — Patient Outreach (Signed)
 Complex Care Management   Visit Note  04/03/2024  Name:  Travis Knight MRN: [id number] DOB: 1965/05/27  [employer] Vietnamese: ID #[id number]  Situation: Referral received for Complex Care Management related to  care coordination to assist with colonoscopy scheduling I obtained verbal consent from Patient.  Visit completed with Patient  on the phone Also on the phone with patient's permission was Alvenia, Wanda Miyamoto (sister in law) assist in management of patient's medical health care..   Background:  No past medical history on file.  Assessment: Patient Reported Symptoms:  Cognitive Cognitive Status: No symptoms reported      Neurological Neurological Review of Symptoms: No symptoms reported    HEENT HEENT Symptoms Reported: No symptoms reported      Cardiovascular Cardiovascular Symptoms Reported: No symptoms reported    Respiratory Respiratory Symptoms Reported: No symptoms reported    Endocrine Endocrine Symptoms Reported: No symptoms reported    Gastrointestinal Gastrointestinal Symptoms Reported: No symptoms reported      Genitourinary Genitourinary Symptoms Reported: No symptoms reported    Integumentary Integumentary Symptoms Reported: No symptoms reported    Musculoskeletal Musculoskelatal Symptoms Reviewed: No symptoms reported        Psychosocial Psychosocial Symptoms Reported: No symptoms reported         There were no vitals filed for this visit.    Medications Reviewed Today   Medications were not reviewed in this encounter   Recommendation:   Continue Current Plan of Care  Follow Up Plan:   Telephone follow up appointment date/time:  05/03/24 at 3:00 pm  Heddy Shutter, RN, MSN, BSN, CCM [place]  [facility], Population Health Case Manager Phone: [phone]

## 2024-04-03 NOTE — Patient Instructions (Signed)
 Thng Tin Cu?c H?n C?m ?n b?n ? dnh th?i gian trao ??i v?i ti hm nay. Vui lng ??ng ng?n ng?i lin h? v?i ti n?u b?n c?n h? tr? tr??c cu?c h?n ti?p theo. Cu?c h?n qu?n l ch?m [place] ti?p theo c?a b?n s? ???c th?c hi?n qua ?i?n tho?i vo ngy 18/04/2024 lc 3:00 chi?u. Vui lng g?i cho nhm h??ng d?n ch?m [place] theo s? [phone] n?u b?n c?n h?y, ??t l?ch ho?c ??i l?ch h?n. N?u b?n ?ang g?p kh?ng ho?ng v? s?c kh?e tm th?n ho?c hnh vi, ho?c c?n ai ? ?? tr chuy?n, vui lng g?i:  ???ng dy nng v? T? t? v Kh?ng ho?ng: [phone] ???ng dy nng qu?c gia phng ch?ng t? t? t?i [place]?: [phone] ho?c TTY: [phone] ?? ni chuy?n v?i m?t chuyn vin t? v?n ???c ?o t?o.  Heddy Shutter, RN, MSN, BSN, CCM [place]  Vi?n Ch?m [place] D?a Trn Gi Tr?, S?c Kh?e Dn S? Qu?n l h? s? ?i?n tho?i: [phone]  __________________________________________________ Visit Information  Thank you for taking time to visit with me today. Please don't hesitate to contact me if I can be of assistance to you before our next scheduled appointment.  Your next care management appointment is by telephone on 05/03/24 at 3:00 pm   Please call the care guide team at [phone] if you need to cancel, schedule, or reschedule an appointment.   Please call the Suicide and Crisis Lifeline: 988 call the [place]  National Suicide Prevention Lifeline: 562-273-3544 or TTY: 306-248-0975 TTY [phone]) to talk to a trained counselor if you are experiencing a Mental Health or Behavioral Health Crisis or need someone to talk to.  Heddy Shutter, RN, MSN, BSN, CCM [place]  [facility], Population Health Case Manager Phone: [phone]

## 2024-04-10 ENCOUNTER — Encounter

## 2024-04-17 ENCOUNTER — Ambulatory Visit

## 2024-04-17 VITALS — Ht 63.0 in | Wt 112.0 lb

## 2024-04-17 DIAGNOSIS — Z1211 Encounter for screening for malignant neoplasm of colon: Secondary | ICD-10-CM

## 2024-04-17 MED ORDER — NA SULFATE-K SULFATE-MG SULF 17.5-3.13-1.6 GM/177ML PO SOLN: 1.0000 | Freq: Once | ORAL | 0 refills | Status: AC

## 2024-04-17 NOTE — Progress Notes (Signed)
 PCP MD at time of PV: Garnette Simpler, MD  __________________________________________________________________________________________________________________________________________  No egg allergy known to patient  No soy allergy known to patient No issues known to pt with past sedation with any surgeries or procedures Patient denies ever being told they had issues or difficulty with intubation  No FH of Malignant Hyperthermia Pt is not on diet pills Pt is not on  home 02  Pt is not on blood thinners  No A fib or A flutter Have any cardiac testing pending-- no  LOA: independent  No Chew or Snuff tobacco __________________________________________________________________________________________________________________________________________  Constipation: no but reports irregular BM with small amounts of stool   Prep: suprep  __________________________________________________________________________________________________________________________________________  PV completed with patient. Interpreter present at apt.  Prep instructions reviewed and provided during apt. Rx sent to preferred pharmacy.  __________________________________________________________________________________________________________________________________________  Patient's chart reviewed by Norleen Schillings CNRA prior to previsit and patient appropriate for the [facility].  Previsit completed and red dot placed by patient's name on their procedure day (on provider's schedule).

## 2024-04-18 ENCOUNTER — Encounter: Payer: Self-pay | Admitting: Internal Medicine

## 2024-04-24 ENCOUNTER — Ambulatory Visit: Admitting: Internal Medicine

## 2024-04-24 ENCOUNTER — Encounter: Payer: Self-pay | Admitting: Internal Medicine

## 2024-04-24 VITALS — BP 129/62 | HR 80 | Temp 98.2°F | Resp 14 | Ht 63.0 in | Wt 112.0 lb

## 2024-04-24 DIAGNOSIS — Z1211 Encounter for screening for malignant neoplasm of colon: Secondary | ICD-10-CM

## 2024-04-24 DIAGNOSIS — K648 Other hemorrhoids: Secondary | ICD-10-CM | POA: Diagnosis not present

## 2024-04-24 MED ORDER — SODIUM CHLORIDE 0.9 % IV SOLN: 500.0000 mL | INTRAVENOUS | Status: DC

## 2024-04-24 NOTE — Patient Instructions (Addendum)
 Handouts provided on hemorrhoids.  Resume previous diet.  Continue present medications.  Repeat colonoscopy in 10 years for screening purposes.   HM NAY QU V? ? TH?C HI?N TH? THU?T N?I SOI T?I TRUNG TM N?I SOI [place]: Vui lng xem bo co th? thu?t ? ???c g?i cho qu v?, n?u qu v? c b?t k? th?c m?c g trong su?t qu trnh th?m khm. N?u bo co th? thu?t khng th? gi?i ?p th?c m?c c?a qu v?, vui lng g?i cho bc s? chuyn khoa tiu ha c?a qu v? ?? ???c gi?i ?p. N?u qu v? ? yu c?u khng cung c?p thng tin chi ti?t v? k?t qu? th? thu?t cho ??i tc ch?m [place] c?a qu v?, th bo co th? thu?t s? ???c g?i trong m?t phong b ???c dn kn ?? qu v? xem khi thu?n ti?n.   QU V? C TH?: C?m gic ch??ng b?ng. Trung ti?n nhi?u h?n bnh th??ng. ?i b? c th? gip ??y ra ngoi khng kh ?i vo ???ng tiu ha trong khi th?c hi?n th? thu?t v gi?m ch??ng b?ng. N?u qu v? ti?n hnh n?i soi d??i (nh? n?i soi ??i trng ho?c soi k?t trng xch-ma b?ng ?ng m?m), qu v? c th? th?y cc ch?m mu ? phn ho?c trn gi?y v? sinh. N?u qu v? ? lm s?ch ??i trng ?? th?c hi?n th? thu?t, qu v? c th? khng ?i ??i ti?n nh? bnh th??ng trong vi ngy.  Vui Lng L?u : Qu v? c th? b? kch ?ng v ngh?t m?i ho?c ch?y n??c m?i. Tnh tr?ng ny l do ?nh h??ng c?a vi?c th? bnh oxy trong qu trnh th?c hi?n th? thu?t. Qu v? khng c?n lo l?ng, tnh tr?ng ny s? bi?n m?t sau m?t ho?c vi ngy.   CC TRI?U CH?NG C?N BO CO NGAY  Sau khi th?c hi?n n?i soi d??i (n?i soi ??i trng ho?c soi k?t trng xch-ma b?ng ?ng m?m): Phn c nhi?u mu ?au b?ng d? d?i ho?c ngy cng t?ng Xu?t hi?n v?t s?ng b?ng m?i, c?p tnh S?t t? 100F tr? ln   Sau khi th?c hi?n n?i soi trn (EGD)  Nn ra mu ho?c ch?t mu c ph s?m Xu?t hi?n c?n ?au ng?c ho?c ?au d??i x??ng b? vai m?i Nu?t ?au ho?c kh nu?t M?i b? kh th? S?t t? 100F tr? ln Phn ?en nh? m?c  ??i v?i cc v?n ?? kh?n c?p ho?c c?p c?u, qu v? c th? lin h? bc s? chuyn khoa tiu  ha b?t k? lc no b?ng cch g?i ??n s? [phone].   CH? ?? ?N U?NG: Chng ti [employer] v? tr??c tin nn ?n nh?, nh?ng sau ? qu v? c th? ?n theo ch? ?? bnh th??ng. U?ng nhi?u n??c nh?ng ph?i trnh ?? u?ng c c?n trong 24 gi?.  HO?T ??NG: Qu v? c?n ln k? ho?ch ?? ngh? ng?i trong ngy hm nay v KHNG NN LI XE ho?c s? d?ng my mc n?ng cho ??n ngy mai (do tc d?ng c?a thu?c an th?n s? d?ng trong th? thu?t).   THEO DI: Nhn vin c?a chng ti s? g?i cho qu v? theo s? ?i?n tho?i trong b?nh n vo ngy lm vi?c ti?p theo sau ngy th?c hi?n th? thu?t ?? ki?m tra tnh tr?ng c?a qu v? v gi?i ?p cc cu h?i ho?c th?c m?c c?a qu v? v? thng tin m qu v? ???c cung c?p sau khi th?c hi?n th? thu?t. N?u chng  ti khng lin l?c ???c v?i qu v?, chng ti s? ?? l?i tin nh?n. Tuy nhin, n?u qu v? c?m th?y kh?e v khng g?p b?t k? s? c? no, qu v? khng c?n g?i l?i cho chng ti. Chng ti s? gi? ??nh r?ng qu v? ? tr? l?i sinh ho?t bnh th??ng v khng g?p b?t k? s? c? no.  N?u qu v? ???c l?y sinh thi?t, chng ti s? lin l?c v?i qu v? qua ?i?n tho?i ho?c th? trong 1-3 tu?n ti?p theo. Vui lng g?i cho chng ti theo s? [phone] n?u qu v? khng nh?n ???c thng tin v? k?t qu? sinh thi?t trong 3 tu?n.  CH? K/B?O M?TBETHA Do v? v/ho?c ??i tc ch?m [place] c?a qu v? ? k vo cc ti li?u s? ???c nh?p vo h? s? y t? ?i?n t? c?a qu v?. Ch? k ny xc nh?n r?ng cc thng tin trn ?y trong B?n Tm T?t [place] Th?m Khm c?a qu v? ? ???c xem xt v hi?u r. Qu v? v/ho?c

## 2024-04-24 NOTE — Progress Notes (Signed)
 Pt's states no medical or surgical changes since previsit or office visit.

## 2024-04-24 NOTE — Op Note (Signed)
 [facility] Patient Name: Travis Knight Procedure Date: 04/24/2024 10:09 AM MRN: [id number] Endoscopist: Norleen SAILOR. Abran , MD, [id number] Age: 58 Referring MD:  Date of Birth: 04-Mar-1966 Gender: Male Account #: [id number] Procedure:                Colonoscopy Indications:              Screening for colorectal malignant neoplasm Medicines:                Monitored Anesthesia Care Procedure:                Pre-Anesthesia Assessment:                           - Prior to the procedure, a History and Physical                            was performed, and patient medications and                            allergies were reviewed. The patient's tolerance of                            previous anesthesia was also reviewed. The risks                            and benefits of the procedure and the sedation                            options and risks were discussed with the patient.                            All questions were answered, and informed consent                            was obtained. Prior Anticoagulants: The patient has                            taken no anticoagulant or antiplatelet agents. ASA                            Grade Assessment: II - A patient with mild systemic                            disease. After reviewing the risks and benefits,                            the patient was deemed in satisfactory condition to                            undergo the procedure.                           After obtaining informed consent, the colonoscope  was passed under direct vision. Throughout the                            procedure, the patient's blood pressure, pulse, and                            oxygen saturations were monitored continuously. The                            Olympus Scope SN: [id number] was introduced through                            the anus and advanced to the the cecum, identified                            by appendiceal  orifice and ileocecal valve. The                            ileocecal valve, appendiceal orifice, and rectum                            were photographed. The quality of the bowel                            preparation was excellent. The colonoscopy was                            performed without difficulty. The patient tolerated                            the procedure well. The bowel preparation used was                            SUPREP via split dose instruction. Scope In: 10:28:40 AM Scope Out: 10:38:39 AM Scope Withdrawal Time: 0 hours 6 minutes 45 seconds  Total Procedure Duration: 0 hours 9 minutes 59 seconds  Findings:                 Internal hemorrhoids were found during retroflexion.                           The entire examined colon appeared normal on direct                            and retroflexion views. Complications:            No immediate complications. Estimated blood loss:                            None. Estimated Blood Loss:     Estimated blood loss: none. Impression:               - Internal hemorrhoids.                           - The entire examined colon  is normal on direct and                            retroflexion views.                           - No specimens collected. Recommendation:           - Repeat colonoscopy in 10 years for screening                            purposes.                           - Patient has a contact number available for                            emergencies. The signs and symptoms of potential                            delayed complications were discussed with the                            patient. Return to normal activities tomorrow.                            Written discharge instructions were provided to the                            patient.                           - Resume previous diet.                           - Continue present medications. Norleen SAILOR. Abran, MD 04/24/2024 10:44:55 AM This report has been  signed electronically.

## 2024-04-24 NOTE — Progress Notes (Signed)
 Sedate, gd SR, tolerated procedure well, VSS, report to RN

## 2024-04-24 NOTE — Progress Notes (Signed)
 HISTORY OF PRESENT ILLNESS:  Travis Knight is a 58 y.o. male sent directly for screening colonoscopy.  No complaints  REVIEW OF SYSTEMS:  All non-GI ROS negative except for  History reviewed. No pertinent past medical history.  Past Surgical History:  Procedure Laterality Date   APPENDECTOMY     CHOLECYSTECTOMY      Social History Travis Knight  reports that he has been smoking cigarettes. He has been exposed to tobacco smoke. He has never used smokeless tobacco. He reports current alcohol use. He reports that he does not use drugs.  family history is not on file.  No Known Allergies     PHYSICAL EXAMINATION: Vital signs: BP (!) 97/51   Pulse 92   Temp 98.2 F (36.8 C) (Temporal)   Ht 5' 3 (1.6 m)   Wt 112 lb (50.8 kg)   SpO2 98%   BMI 19.84 kg/m  General: Well-developed, well-nourished, no acute distress HEENT: Sclerae are anicteric, conjunctiva pink. Oral mucosa intact Lungs: Clear Heart: Regular Abdomen: soft, nontender, nondistended, no obvious ascites, no peritoneal signs, normal bowel sounds. No organomegaly. Extremities: No edema Psychiatric: alert and oriented x3. Cooperative     ASSESSMENT:  Colon cancer screening   PLAN: Screening colonoscopy

## 2024-04-25 ENCOUNTER — Telehealth: Payer: Self-pay

## 2024-04-25 NOTE — Telephone Encounter (Signed)
 Follow up call to pt, no answer.

## 2024-05-03 ENCOUNTER — Other Ambulatory Visit: Payer: Self-pay

## 2024-05-03 NOTE — Patient Outreach (Signed)
 Complex Care Management   Visit Note  05/03/2024  Name:  Travis Knight MRN: [id number] DOB: February 21, 1966  [employer] Vietnamese PI#[id number]  Situation: Referral received for Complex Care Management related tocare coordination to assist with colonoscopy scheduling I obtained verbal patient from Patient.  Visit completed with [name](dpr)  on the phone  Background:  No past medical history on file.  Assessment: Patent declines to complete assessment(busy working). Per Wanda Miyamoto, patient completed procedure; is doing fine with no questions or concerns. Patient Reported Symptoms: None  There were no vitals filed for this visit.   Patient declines to complete assessment. Medications Reviewed Today   Medications were not reviewed in this encounter   Recommendation:   No recommendations  Follow Up Plan:   Patient has met all care management goals. Care Management case will be closed. Patient has been provided contact information should new needs arise.   Heddy Shutter, RN, MSN, BSN, CCM [place]  [facility], Population Health Case Manager Phone: [phone]

## 2024-05-03 NOTE — Patient Instructions (Signed)
°  Thng Tin [place]uy?n Th?m C?m ?n b?n ? dnh th?i gian g?p ti hm nay. Vui lng ??ng ng?n ng?i lin h? v?i ti n?u ti c th? h? tr? b?n. B?nh nhn ? ??t t?t c? cc m?c tiu qu?n l ch?m [place]. H? s? qu?n l ch?m [place] s? ???c ?ng l?i. B?nh nhn ? ???c cung c?p thng tin lin h? trong tr??ng h?p pht sinh nhu c?u m?i. Vui lng g?i cho ??i h??ng d?n ch?m [place] theo s? [phone] n?u b?n c?n h?y, ??t l?ch ho?c ??i l?ch h?n. Vui lng g?i ???ng dy nng T? t? v Kh?ng ho?ng: [phone] ho?c g?i ???ng dy nng Qu?c gia Phng ch?ng T? t? [place] POUR?: [phone] ho?c TTY: [phone]) ?? ni chuy?n v?i m?t nhn vin t? v?n ???c ?o t?o n?u b?n ?ang g?p kh?ng ho?ng v? s?c kh?e tm th?n ho?c hnh vi, ho?c c?n ai ? ?? tr chuy?n. Heddy Shutter, RN, MSN, BSN, CCM [place]  Vi?n Ch?m  D?a trn Gi tr?, S?c kh?e Dn s? Qu?n l h? s? ?i?n tho?i: [phone]  ____________________________________________________________________ Visit Information  Thank you for taking time to visit with me today. Please don't hesitate to contact me if I can be of assistance to you.   Patient has met all care management goals. Care Management case will be closed. Patient has been provided contact information should new needs arise.   Please call the care guide team at [phone] if you need to cancel, schedule, or reschedule an appointment.   Please call the Suicide and Crisis Lifeline: 988 call the [place]  National Suicide Prevention Lifeline: 442-394-6316 or TTY: (940)790-1749 TTY [phone]) to talk to a trained counselor if you are experiencing a Mental Health or Behavioral Health Crisis or need someone to talk to.  Heddy Shutter, RN, MSN, BSN, CCM [place]  [facility], Population Health Case Manager Phone: [phone]

## 2024-05-22 ENCOUNTER — Other Ambulatory Visit: Payer: Self-pay | Admitting: Family Medicine

## 2024-05-22 DIAGNOSIS — R399 Unspecified symptoms and signs involving the genitourinary system: Secondary | ICD-10-CM

## 2024-08-15 ENCOUNTER — Ambulatory Visit: Admitting: Family Medicine
